# Patient Record
Sex: Female | Born: 1980 | Race: White | Hispanic: Yes | Marital: Single | Smoking: Never smoker
Health system: Southern US, Community
[De-identification: ages and names within clinical notes are randomized; demographics above are authoritative.]

## PROBLEM LIST (undated history)

## (undated) ENCOUNTER — Emergency Department (HOSPITAL_COMMUNITY): Admission: EM | Payer: BLUE CROSS/BLUE SHIELD | Source: Home / Self Care

## (undated) DIAGNOSIS — E559 Vitamin D deficiency, unspecified: Secondary | ICD-10-CM

## (undated) DIAGNOSIS — D649 Anemia, unspecified: Secondary | ICD-10-CM

## (undated) DIAGNOSIS — N83209 Unspecified ovarian cyst, unspecified side: Secondary | ICD-10-CM

## (undated) HISTORY — PX: CHOLECYSTECTOMY: SHX55

## (undated) HISTORY — PX: DILATION AND CURETTAGE OF UTERUS: SHX78

## (undated) HISTORY — DX: Vitamin D deficiency, unspecified: E55.9

## (undated) HISTORY — DX: Anemia, unspecified: D64.9

---

## 2005-09-16 ENCOUNTER — Encounter (INDEPENDENT_AMBULATORY_CARE_PROVIDER_SITE_OTHER): Payer: Self-pay | Admitting: Specialist

## 2005-09-16 ENCOUNTER — Inpatient Hospital Stay (HOSPITAL_COMMUNITY): Admission: EM | Admit: 2005-09-16 | Discharge: 2005-09-18 | Payer: Self-pay | Admitting: Emergency Medicine

## 2006-08-04 ENCOUNTER — Emergency Department (HOSPITAL_COMMUNITY): Admission: EM | Admit: 2006-08-04 | Discharge: 2006-08-04 | Payer: Self-pay | Admitting: Emergency Medicine

## 2006-12-16 ENCOUNTER — Ambulatory Visit: Payer: Self-pay | Admitting: Obstetrics & Gynecology

## 2007-08-03 ENCOUNTER — Ambulatory Visit: Payer: Self-pay | Admitting: Obstetrics and Gynecology

## 2007-08-03 ENCOUNTER — Encounter: Payer: Self-pay | Admitting: Obstetrics and Gynecology

## 2007-09-18 IMAGING — RF DG CHOLANGIOGRAM OPERATIVE
1 series · 4 of 4 positions shown · non-contrast
Comparison: none

CLINICAL DATA: Cholelithiasis

[Series 1: run · 4 of 99 frames shown]
[frame 15/99]
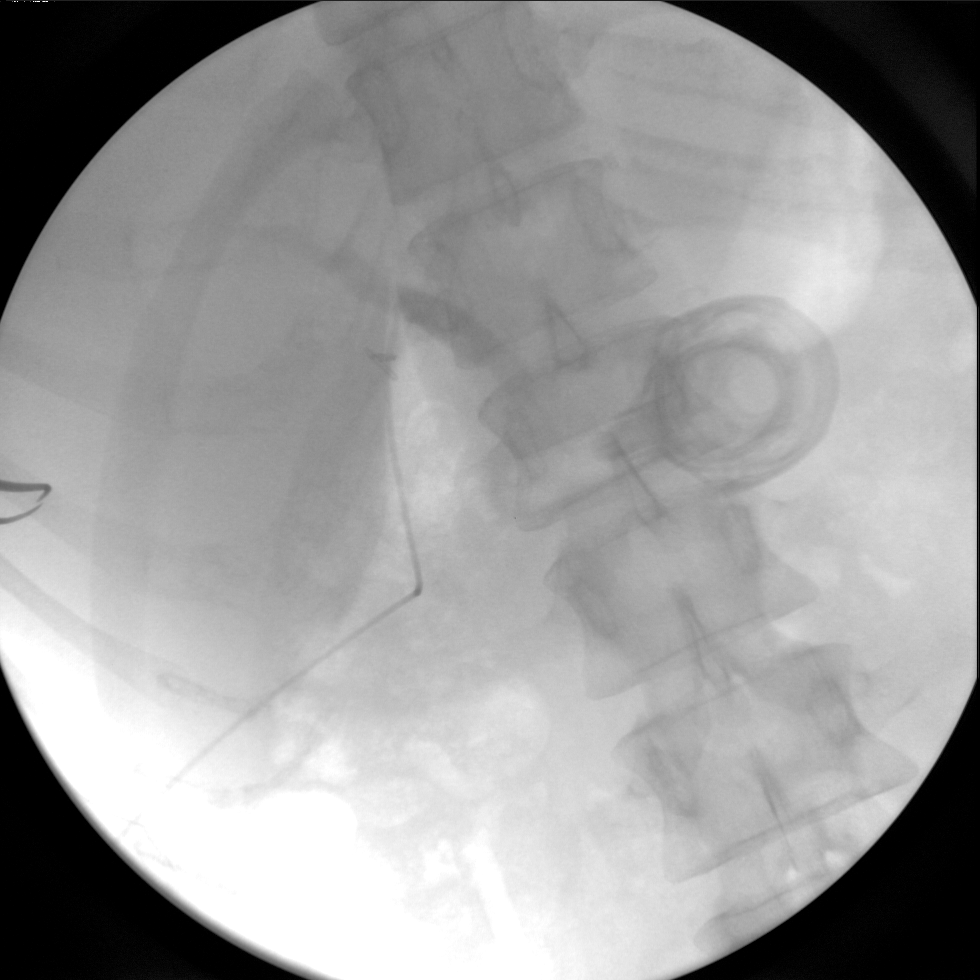
[frame 50/99]
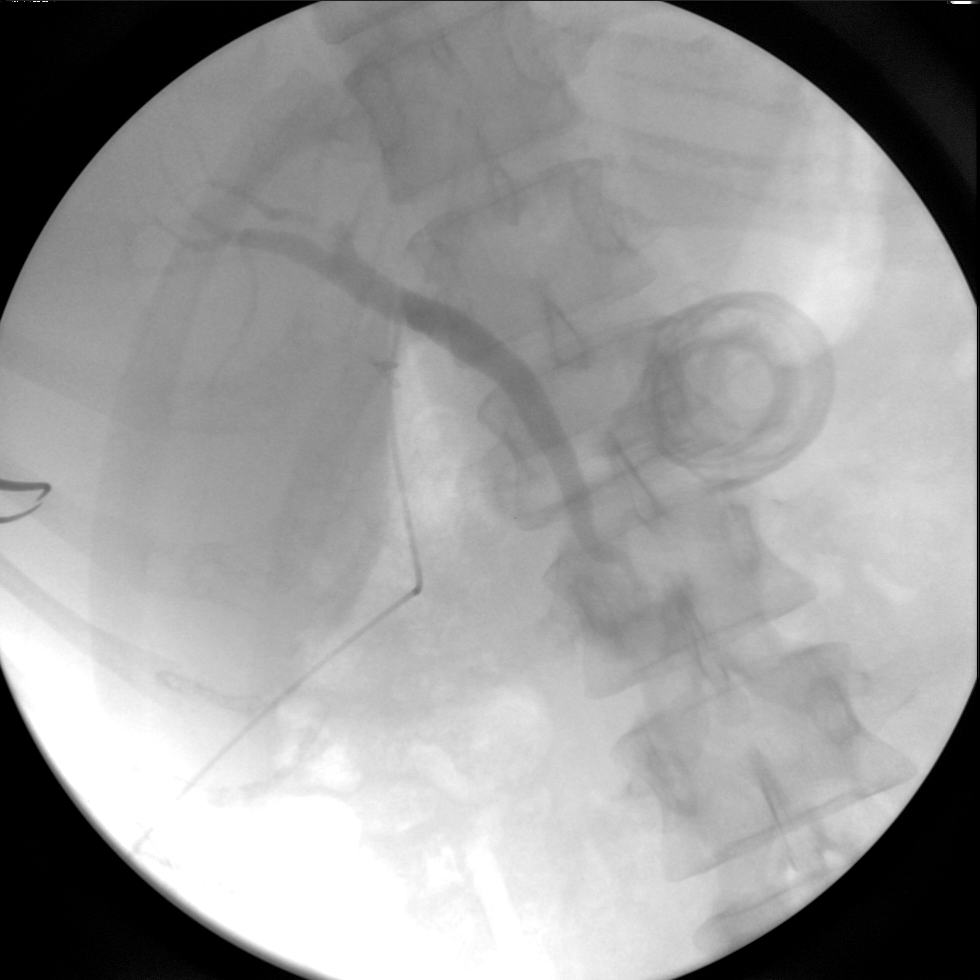
[frame 85/99]
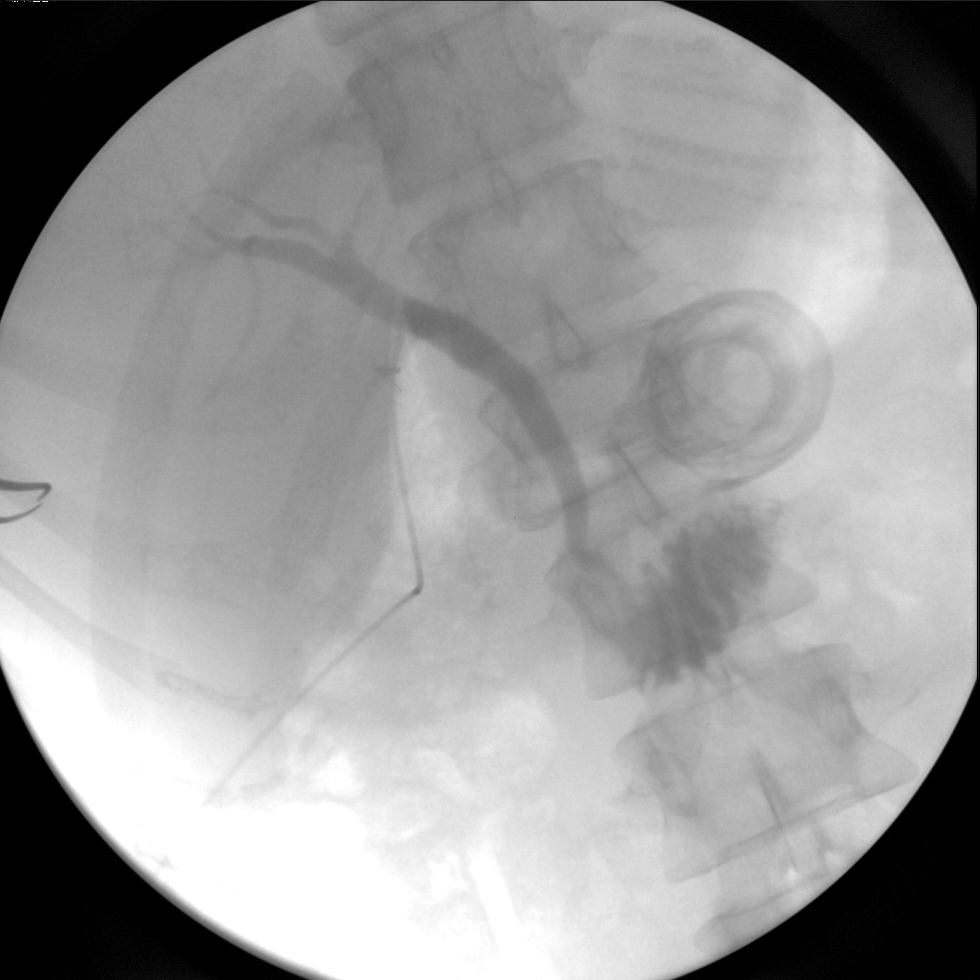
[frame 98/99]
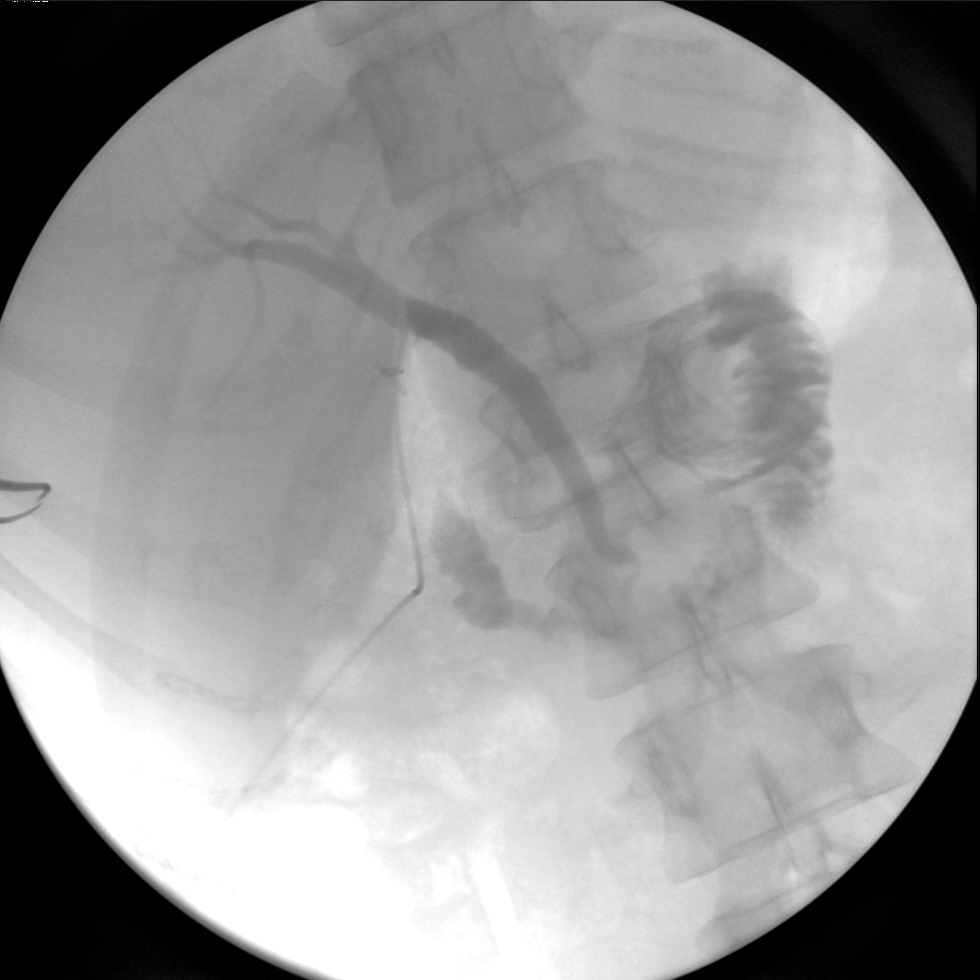

[4 of 4 positions shown; findings below may reference images not displayed]

INTRAOPERATIVE CHOLANGIOGRAM:

99  images from intraoperative C-arm fluoroscopy demonstrate  opacification of
the common bile duct. No filling defects to suggest retained stones. There is
incomplete evaluation of intrahepatic biliary tree, which appears decompressed
centrally. Contrast appears to flow on into decompressed duodenum.
IMPRESSION: 1. Negative for retained common duct stone

## 2010-11-28 NOTE — Op Note (Signed)
NAMEMELAINE, Heidi Bender               ACCOUNT NO.:  [id number]   MEDICAL RECORD NO.:  [id number]          PATIENT TYPE:  INP   LOCATION:  2550                         FACILITY:  [facility]   PHYSICIAN:  Adolph Pollack, M.D.DATE OF BIRTH:  11-02-1980   DATE OF PROCEDURE:  09/16/2005  DATE OF DISCHARGE:                                 OPERATIVE REPORT   PREOP DIAGNOSIS:  Evolving acute cholecystitis.   POSTOP:  Evolving acute cholecystitis.   PROCEDURE:  Laparoscopic cholecystectomy with intraoperative cholangiogram.   SURGEON:  Rosenbower.   ASSISTANT:  Chevis Pretty.   ANESTHESIA:  General.   INDICATIONS:  30 year old female has 4-day history of right upper quadrant  pain, nausea, vomiting. Ultrasound suggested acute edematous changes with  gallstones. She now presents for cholecystectomy.   TECHNIQUE:  She is brought to the holding area and brought to the operating  room, placed supine on the operating room. General anesthetic was  administered. The abdominal wall sterilely prepped and draped. Dilute  Marcaine solution was infiltrated in the subumbilical region. A small  subumbilical incision was made through skin and subcutaneous tissue, midline  fascia and peritoneum entering the peritoneal cavity under direct vision. A  pursestring suture of 0 Vicryl was placed around fascial edges. A Hasson  trocar was introduced into the peritoneal cavity. Pneumoperitoneum was  created by insufflation of CO2 gas.   Next laparoscope was introduced and she is placed in reverse Trendelenburg  position with right side tilted slightly up. 11 mm trocar was placed through  an epigastric incision two 5 mm trocars placed in the right mid abdomen. The  fundus of the gallbladder was grasped.  The gallbladder is noted to be  mildly to moderately inflamed and edematous. The fundus was retracted toward  the right shoulder. I then  grasped the infundibulum, then dissected omental adhesions free from it.  The  infundibulum was then mobilized using blunt dissection on the gallbladder. I  was then able to retract the infundibulum laterally and identify the cystic  duct and create a window around it. A clip was placed at the cystic duct  gallbladder junction and small incision made in the cystic duct and some  bile milked back. The Cholangiocath was passed through the anterior  abdominal wall into the cystic duct and cholangiogram was performed.   Under real time fluoroscopy dilute contrast was injected to the cystic duct.  The common hepatic duct, right and left hepatic ducts, and common duct all  filled. The contrast drained rapidly into the duodenum without obvious  evidence of obstruction. Final reports pending radiologist's interpretation.   The cholangiocatheter was removed, and the cystic duct was clipped three  times proximally and divided. I then identified an anterior branch of the  cystic artery clipped it and divided it. The posterior branch of the cystic  artery was identified, clipped and divided. The gallbladder was dissected  free from the liver using electrocautery, placed in Endopouch bag.   I irrigated out the gallbladder fossa. Bleeding was controlled with  electrocautery. I then placed Surgicel into the gallbladder fossa. I  reinspected  the area, it was hemostatic without evidence of bile leak. I  evacuated as much of the irrigation fluid as possible.   The gallbladder was then removed through the subumbilical port in the  Endopouch bag and multiple stones were noted. The subumbilical fascial  defect was closed under laparoscopic vision by tightening up and tying down  the pursestring suture. The remaining trocars were removed and  pneumoperitoneum was released. The skin incisions were closed with 4-0  Monocryl subcuticular stitches followed by Steri-Strips and sterile  dressings.   She tolerated the procedure without apparent complications and was taken to  recovery  in satisfactory condition.      Adolph Pollack, M.D.  Electronically Signed     TJR/MEDQ  D:  09/16/2005  T:  09/17/2005  Job:  [id number]

## 2010-11-28 NOTE — H&P (Signed)
NAMEBERLIN, VIERECK               ACCOUNT NO.:  [id number]   MEDICAL RECORD NO.:  [id number]          PATIENT TYPE:  INP   LOCATION:  2550                         FACILITY:  [facility]   PHYSICIAN:  Adolph Pollack, M.D.DATE OF BIRTH:  [birth year]   DATE OF ADMISSION:  09/16/2005  DATE OF DISCHARGE:                                HISTORY & PHYSICAL   CHIEF COMPLAINT:  Recurrent right upper quadrant pain, nausea and vomiting  for the past four days.   HISTORY OF PRESENT ILLNESS:  This 30 year old female began having some right  upper quadrant pain fairly severe with nausea and vomiting Saturday.  It has  continued every day since then including today.  She denies any fever or  chills.  She has had some diarrhea.  She eventually presented to the  emergency department where she was evaluated.  She is noted to have a normal  white blood cell count, a normal liver function test and lipase, however, an  ultrasound demonstrated an edematous gallbladder with gallstones.  I was  then asked to see her.   PAST MEDICAL HISTORY:  No chronic illnesses.   PREVIOUS OPERATIONS:  Dilatation and curettage.   MEDICATIONS:  Birth control pills.   ALLERGIES:  None.   SOCIAL HISTORY:  Single, unemployed, parents are with her.   REVIEW OF SYSTEMS:  CARDIOVASCULAR:  No heart disease, hypertension.  PULMONARY:  No chronic lung disease.  GI:  No hepatitis or gastrointestinal  disease.  GU:  No kidney stones or urinary problems.  HEMATOLOGIC:  No  bleeding disorders.   PHYSICAL EXAMINATION:  GENERAL:  A slightly ill-appearing female.  VITAL SIGNS:  Temperature is 98.3, blood pressure 95/63, pulse of 83.  EYES:  Extraocular motions intact.  No icterus.  NECK:  Supple without masses or obvious thyroid enlargement.  RESPIRATORY:  Breath sounds equal and clear.  Respirations are non-labored.  CARDIOVASCULAR:  Heart demonstrates a regular rate and rhythm.  No murmur  heard.  No lower extremity edema.  ABDOMEN:  Soft.  There is right upper quadrant tenderness without mass.  No  palpable masses.  A few bowel sounds are heard.  MUSCULOSKELETAL:  Good range of motion.  Good muscle tone.   LABORATORY DATA:  Liver function tests and white blood cell count are within  normal limits.  Pregnancy test negative.   IMPRESSION:  Evolving acute cholecystitis with cholelithiasis.   PLAN:  1.  Admission to the hospital.  2.  IV antibiotics.  3.  Laparoscopic cholecystectomy.  I went over the procedure, rationale and      risks through an interpreter with her and her family.  The risks      include, but are not limited to, bleeding, infection, wound healing      problems, anesthesia, accidental damage to intra-abdominal organs in the      area of the gallbladder, and post cholecystectomy diarrhea.  She seemed      to understand and agrees to proceed.      Adolph Pollack, M.D.  Electronically Signed     TJR/MEDQ  D:  09/16/2005  T:  09/16/2005  Job:  [id number]

## 2010-11-28 NOTE — Discharge Summary (Signed)
NAMELOURENE, HOSTON NO.:  [id number]   MEDICAL RECORD NO.:  [id number]          PATIENT TYPE:  INP   LOCATION:  5703                         FACILITY:  [facility]   PHYSICIAN:  Adolph Pollack, M.D.DATE OF BIRTH:  1981/07/02   DATE OF ADMISSION:  09/16/2005  DATE OF DISCHARGE:  09/18/2005                                 DISCHARGE SUMMARY   PRINCIPAL DISCHARGE DIAGNOSIS:  Chronic cholecystitis, cholelithiasis.   SECONDARY DIAGNOSIS:  None.   PROCEDURE:  Laparoscopic cholecystectomy.   REASON FOR ADMISSION:  This 30 year old female has a four-day history of  right upper quadrant pain, nausea and vomiting that is persisting.  Despite  pain medications in the emergency department, she still has the pain and was  admitted.   HOSPITAL COURSE:  She had an ultrasound showing an edematous gallbladder  wall, cholelithiasis and this was felt to be evolving into acute  cholecystitis.  She subsequently underwent a laparoscopic cholecystectomy  which she tolerated fairly well.  She had some periumbilical pain postop 1  but by her second postoperative day, she was much more comfortable and able  to be discharged.   DISPOSITION:  Discharge to home September 18, 2005 in satisfactory condition.  She was given discharge instructions and analgesic and will follow up in the  office in 1-2 weeks.      Adolph Pollack, M.D.  Electronically Signed     TJR/MEDQ  D:  10/30/2005  T:  10/31/2005  Job:  [id number]

## 2011-04-30 LAB — POCT PREGNANCY, URINE: Operator id: 149021

## 2023-02-10 ENCOUNTER — Encounter (HOSPITAL_COMMUNITY): Payer: Self-pay

## 2023-02-10 ENCOUNTER — Other Ambulatory Visit: Payer: Self-pay

## 2023-02-10 ENCOUNTER — Inpatient Hospital Stay (HOSPITAL_COMMUNITY)
Admission: EM | Admit: 2023-02-10 | Discharge: 2023-02-14 | DRG: 399 | Disposition: A | Discharge status: Home / Self Care | Payer: BLUE CROSS/BLUE SHIELD | Attending: General Surgery | Admitting: General Surgery

## 2023-02-10 DIAGNOSIS — K353 Acute appendicitis with localized peritonitis, without perforation or gangrene: Principal | ICD-10-CM

## 2023-02-10 DIAGNOSIS — Z9049 Acquired absence of other specified parts of digestive tract: Secondary | ICD-10-CM

## 2023-02-10 DIAGNOSIS — K381 Appendicular concretions: Secondary | ICD-10-CM | POA: Diagnosis present

## 2023-02-10 DIAGNOSIS — K660 Peritoneal adhesions (postprocedural) (postinfection): Secondary | ICD-10-CM | POA: Diagnosis present

## 2023-02-10 DIAGNOSIS — K3532 Acute appendicitis with perforation and localized peritonitis, without abscess: Principal | ICD-10-CM | POA: Diagnosis present

## 2023-02-10 HISTORY — DX: Unspecified ovarian cyst, unspecified side: N83.209

## 2023-02-10 LAB — COMPREHENSIVE METABOLIC PANEL
ALT: 61 U/L — ABNORMAL HIGH (ref 0–44)
AST: 47 U/L — ABNORMAL HIGH (ref 15–41)
Albumin: 3.9 g/dL (ref 3.5–5.0)
Alkaline Phosphatase: 72 U/L (ref 38–126)
Anion gap: 12 (ref 5–15)
BUN: 8 mg/dL (ref 6–20)
CO2: 20 mmol/L — ABNORMAL LOW (ref 22–32)
Calcium: 8.9 mg/dL (ref 8.9–10.3)
Chloride: 100 mmol/L (ref 98–111)
Creatinine, Ser: 0.69 mg/dL (ref 0.44–1.00)
GFR, Estimated: >60 mL/min (ref >60)
Glucose, Bld: 100 mg/dL — ABNORMAL HIGH (ref 70–99)
Potassium: 4 mmol/L (ref 3.5–5.1)
Sodium: 132 mmol/L — ABNORMAL LOW (ref 135–145)
Total Bilirubin: 1 mg/dL (ref 0.3–1.2)
Total Protein: 8.6 g/dL — ABNORMAL HIGH (ref 6.5–8.1)

## 2023-02-10 LAB — URINALYSIS, ROUTINE W REFLEX MICROSCOPIC
Bilirubin Urine: NEGATIVE
Glucose, UA: NEGATIVE mg/dL
Ketones, ur: NEGATIVE mg/dL
Leukocytes,Ua: NEGATIVE
Nitrite: NEGATIVE
Protein, ur: NEGATIVE mg/dL
Specific Gravity, Urine: 1.01 (ref 1.005–1.030)
pH: 7 (ref 5.0–8.0)

## 2023-02-10 LAB — URINALYSIS, MICROSCOPIC (REFLEX): Bacteria, UA: NONE SEEN

## 2023-02-10 LAB — HCG, SERUM, QUALITATIVE: Preg, Serum: NEGATIVE

## 2023-02-10 LAB — LIPASE, BLOOD: Lipase: 33 U/L (ref 11–51)

## 2023-02-10 LAB — CBC
HCT: 43.8 % (ref 36.0–46.0)
Hemoglobin: 14.1 g/dL (ref 12.0–15.0)
MCH: 27.8 pg (ref 26.0–34.0)
MCHC: 32.2 g/dL (ref 30.0–36.0)
MCV: 86.2 fL (ref 80.0–100.0)
Platelets: 261 10*3/uL (ref 150–400)
RBC: 5.08 MIL/uL (ref 3.87–5.11)
RDW: 15 % (ref 11.5–15.5)
WBC: 9 10*3/uL (ref 4.0–10.5)
nRBC: 0 % (ref 0.0–0.2)

## 2023-02-10 NOTE — ED Triage Notes (Signed)
Pt c/o RLQ abdominal pain that radiate into back and bloatingxmo. Pt states it got worse last night. Pt c/o nausea. Pt states had vomiting a week ago

## 2023-02-10 NOTE — ED Notes (Signed)
I  used interpreter to triage pt

## 2023-02-11 ENCOUNTER — Encounter (HOSPITAL_COMMUNITY): Payer: Self-pay

## 2023-02-11 ENCOUNTER — Encounter (HOSPITAL_COMMUNITY): Admission: EM | Disposition: A | Discharge status: Home / Self Care | Payer: Self-pay | Source: Home / Self Care

## 2023-02-11 ENCOUNTER — Emergency Department (HOSPITAL_COMMUNITY): Payer: BLUE CROSS/BLUE SHIELD

## 2023-02-11 ENCOUNTER — Emergency Department (HOSPITAL_COMMUNITY): Payer: BLUE CROSS/BLUE SHIELD | Admitting: Certified Registered"

## 2023-02-11 ENCOUNTER — Other Ambulatory Visit: Payer: Self-pay

## 2023-02-11 DIAGNOSIS — K381 Appendicular concretions: Secondary | ICD-10-CM | POA: Diagnosis present

## 2023-02-11 DIAGNOSIS — K3532 Acute appendicitis with perforation and localized peritonitis, without abscess: Secondary | ICD-10-CM | POA: Diagnosis present

## 2023-02-11 DIAGNOSIS — K353 Acute appendicitis with localized peritonitis, without perforation or gangrene: Secondary | ICD-10-CM | POA: Diagnosis present

## 2023-02-11 DIAGNOSIS — K660 Peritoneal adhesions (postprocedural) (postinfection): Secondary | ICD-10-CM | POA: Diagnosis present

## 2023-02-11 DIAGNOSIS — Z9049 Acquired absence of other specified parts of digestive tract: Secondary | ICD-10-CM | POA: Diagnosis not present

## 2023-02-11 HISTORY — PX: LAPAROSCOPIC APPENDECTOMY: SHX408

## 2023-02-11 LAB — HIV ANTIBODY (ROUTINE TESTING W REFLEX): HIV Screen 4th Generation wRfx: NONREACTIVE

## 2023-02-11 SURGERY — APPENDECTOMY, LAPAROSCOPIC
Anesthesia: General | Site: Abdomen

## 2023-02-11 MED ORDER — OXYCODONE HCL 5 MG PO TABS
5.0000 mg | ORAL_TABLET | ORAL | Status: DC | PRN
  Administered 2023-02-11: 10 mg via ORAL
  Filled 2023-02-11: qty 2

## 2023-02-11 MED ORDER — 0.9 % SODIUM CHLORIDE (POUR BTL) OPTIME
TOPICAL | Status: DC | PRN
  Administered 2023-02-11: 1000 mL

## 2023-02-11 MED ORDER — DIPHENHYDRAMINE HCL 50 MG/ML IJ SOLN: 25.0000 mg | Freq: Four times a day (QID) | INTRAMUSCULAR | Status: DC | PRN

## 2023-02-11 MED ORDER — ENOXAPARIN SODIUM 40 MG/0.4ML IJ SOSY
40.0000 mg | PREFILLED_SYRINGE | INTRAMUSCULAR | Status: DC
  Administered 2023-02-12 – 2023-02-14 (×3): 40 mg via SUBCUTANEOUS
  Filled 2023-02-11 (×3): qty 0.4

## 2023-02-11 MED ORDER — FENTANYL CITRATE (PF) 250 MCG/5ML IJ SOLN
INTRAMUSCULAR | Status: AC
  Filled 2023-02-11: qty 5

## 2023-02-11 MED ORDER — KCL IN DEXTROSE-NACL 20-5-0.45 MEQ/L-%-% IV SOLN
INTRAVENOUS | Status: DC
  Filled 2023-02-11 (×2): qty 1000

## 2023-02-11 MED ORDER — BUPIVACAINE-EPINEPHRINE 0.25% -1:200000 IJ SOLN
INTRAMUSCULAR | Status: DC | PRN
  Administered 2023-02-11: 17 mL

## 2023-02-11 MED ORDER — PANTOPRAZOLE SODIUM 40 MG PO TBEC
40.0000 mg | DELAYED_RELEASE_TABLET | Freq: Every day | ORAL | Status: DC
  Administered 2023-02-11 – 2023-02-14 (×4): 40 mg via ORAL
  Filled 2023-02-11 (×5): qty 1

## 2023-02-11 MED ORDER — HYDRALAZINE HCL 20 MG/ML IJ SOLN: 10.0000 mg | INTRAMUSCULAR | Status: DC | PRN

## 2023-02-11 MED ORDER — ONDANSETRON HCL 4 MG/2ML IJ SOLN
INTRAMUSCULAR | Status: AC
  Filled 2023-02-11: qty 4

## 2023-02-11 MED ORDER — METHOCARBAMOL 500 MG PO TABS
500.0000 mg | ORAL_TABLET | Freq: Three times a day (TID) | ORAL | Status: DC | PRN
  Administered 2023-02-11: 500 mg via ORAL
  Filled 2023-02-11: qty 1

## 2023-02-11 MED ORDER — DEXAMETHASONE SODIUM PHOSPHATE 10 MG/ML IJ SOLN
INTRAMUSCULAR | Status: AC
  Filled 2023-02-11: qty 2

## 2023-02-11 MED ORDER — MORPHINE SULFATE (PF) 2 MG/ML IV SOLN
1.0000 mg | INTRAVENOUS | Status: DC | PRN
  Administered 2023-02-11 – 2023-02-12 (×2): 2 mg via INTRAVENOUS
  Filled 2023-02-11 (×2): qty 1

## 2023-02-11 MED ORDER — FENTANYL CITRATE (PF) 100 MCG/2ML IJ SOLN
INTRAMUSCULAR | Status: AC
  Filled 2023-02-11: qty 2

## 2023-02-11 MED ORDER — METHOCARBAMOL 1000 MG/10ML IJ SOLN: 500.0000 mg | Freq: Three times a day (TID) | INTRAVENOUS | Status: DC | PRN

## 2023-02-11 MED ORDER — SODIUM CHLORIDE 0.9 % IV SOLN
2.0000 g | Freq: Once | INTRAVENOUS | Status: AC
  Administered 2023-02-11: 2 g via INTRAVENOUS
  Filled 2023-02-11: qty 20

## 2023-02-11 MED ORDER — LACTATED RINGERS IV SOLN: INTRAVENOUS | Status: DC

## 2023-02-11 MED ORDER — MIDAZOLAM HCL 2 MG/2ML IJ SOLN
INTRAMUSCULAR | Status: AC
  Filled 2023-02-11: qty 2

## 2023-02-11 MED ORDER — OXYCODONE HCL 5 MG/5ML PO SOLN: 5.0000 mg | Freq: Once | ORAL | Status: DC | PRN

## 2023-02-11 MED ORDER — SUGAMMADEX SODIUM 200 MG/2ML IV SOLN
INTRAVENOUS | Status: DC | PRN
  Administered 2023-02-11: 200 mg via INTRAVENOUS

## 2023-02-11 MED ORDER — ACETAMINOPHEN 325 MG PO TABS: 325.0000 mg | ORAL_TABLET | ORAL | Status: DC | PRN

## 2023-02-11 MED ORDER — CHLORHEXIDINE GLUCONATE 0.12 % MT SOLN
15.0000 mL | Freq: Once | OROMUCOSAL | Status: AC
  Administered 2023-02-11: 15 mL via OROMUCOSAL
  Filled 2023-02-11: qty 15

## 2023-02-11 MED ORDER — LIDOCAINE VISCOUS HCL 2 % MT SOLN
15.0000 mL | Freq: Once | OROMUCOSAL | Status: AC
  Administered 2023-02-11: 15 mL via ORAL
  Filled 2023-02-11: qty 15

## 2023-02-11 MED ORDER — MIDAZOLAM HCL 2 MG/2ML IJ SOLN
INTRAMUSCULAR | Status: DC | PRN
  Administered 2023-02-11: 2 mg via INTRAVENOUS

## 2023-02-11 MED ORDER — DIPHENHYDRAMINE HCL 50 MG/ML IJ SOLN
INTRAMUSCULAR | Status: AC
  Filled 2023-02-11: qty 1

## 2023-02-11 MED ORDER — ALUM & MAG HYDROXIDE-SIMETH 200-200-20 MG/5ML PO SUSP
30.0000 mL | Freq: Once | ORAL | Status: AC
  Administered 2023-02-11: 30 mL via ORAL
  Filled 2023-02-11: qty 30

## 2023-02-11 MED ORDER — ROCURONIUM BROMIDE 10 MG/ML (PF) SYRINGE
PREFILLED_SYRINGE | INTRAVENOUS | Status: DC | PRN
  Administered 2023-02-11: 65 mg via INTRAVENOUS
  Administered 2023-02-11: 15 mg via INTRAVENOUS

## 2023-02-11 MED ORDER — METOPROLOL TARTRATE 5 MG/5ML IV SOLN: 5.0000 mg | Freq: Four times a day (QID) | INTRAVENOUS | Status: DC | PRN

## 2023-02-11 MED ORDER — PROMETHAZINE HCL 25 MG/ML IJ SOLN: 6.2500 mg | INTRAMUSCULAR | Status: DC | PRN

## 2023-02-11 MED ORDER — KETOROLAC TROMETHAMINE 60 MG/2ML IM SOLN
30.0000 mg | Freq: Once | INTRAMUSCULAR | Status: AC
  Administered 2023-02-11: 30 mg via INTRAMUSCULAR
  Filled 2023-02-11: qty 2

## 2023-02-11 MED ORDER — SODIUM CHLORIDE 0.9 % IR SOLN
Status: DC | PRN
  Administered 2023-02-11: 1000 mL

## 2023-02-11 MED ORDER — ONDANSETRON HCL 4 MG/2ML IJ SOLN
INTRAMUSCULAR | Status: DC | PRN
  Administered 2023-02-11: 4 mg via INTRAVENOUS

## 2023-02-11 MED ORDER — ORAL CARE MOUTH RINSE: 15.0000 mL | Freq: Once | OROMUCOSAL | Status: AC

## 2023-02-11 MED ORDER — IOHEXOL 350 MG/ML SOLN
75.0000 mL | Freq: Once | INTRAVENOUS | Status: AC | PRN
  Administered 2023-02-11: 75 mL via INTRAVENOUS

## 2023-02-11 MED ORDER — BUPIVACAINE-EPINEPHRINE (PF) 0.25% -1:200000 IJ SOLN
INTRAMUSCULAR | Status: AC
  Filled 2023-02-11: qty 30

## 2023-02-11 MED ORDER — KETOROLAC TROMETHAMINE 30 MG/ML IJ SOLN
30.0000 mg | Freq: Four times a day (QID) | INTRAMUSCULAR | Status: DC
  Administered 2023-02-11 – 2023-02-14 (×11): 30 mg via INTRAVENOUS
  Filled 2023-02-11 (×11): qty 1

## 2023-02-11 MED ORDER — FENTANYL CITRATE (PF) 100 MCG/2ML IJ SOLN
25.0000 ug | INTRAMUSCULAR | Status: DC | PRN
  Administered 2023-02-11: 50 ug via INTRAVENOUS

## 2023-02-11 MED ORDER — PROPOFOL 10 MG/ML IV BOLUS
INTRAVENOUS | Status: AC
  Filled 2023-02-11: qty 20

## 2023-02-11 MED ORDER — ONDANSETRON 4 MG PO TBDP: 4.0000 mg | ORAL_TABLET | Freq: Four times a day (QID) | ORAL | Status: DC | PRN

## 2023-02-11 MED ORDER — ACETAMINOPHEN 500 MG PO TABS
1000.0000 mg | ORAL_TABLET | Freq: Four times a day (QID) | ORAL | Status: DC
  Administered 2023-02-11 – 2023-02-14 (×11): 1000 mg via ORAL
  Filled 2023-02-11 (×11): qty 2

## 2023-02-11 MED ORDER — METRONIDAZOLE 500 MG/100ML IV SOLN
500.0000 mg | Freq: Once | INTRAVENOUS | Status: AC
  Administered 2023-02-11: 500 mg via INTRAVENOUS
  Filled 2023-02-11: qty 100

## 2023-02-11 MED ORDER — FENTANYL CITRATE (PF) 250 MCG/5ML IJ SOLN
INTRAMUSCULAR | Status: DC | PRN
  Administered 2023-02-11: 100 ug via INTRAVENOUS
  Administered 2023-02-11: 25 ug via INTRAVENOUS

## 2023-02-11 MED ORDER — ACETAMINOPHEN 160 MG/5ML PO SOLN: 325.0000 mg | ORAL | Status: DC | PRN

## 2023-02-11 MED ORDER — LIDOCAINE 2% (20 MG/ML) 5 ML SYRINGE
INTRAMUSCULAR | Status: DC | PRN
  Administered 2023-02-11: 40 mg via INTRAVENOUS

## 2023-02-11 MED ORDER — SODIUM CHLORIDE 0.9 % IV BOLUS
1000.0000 mL | Freq: Once | INTRAVENOUS | Status: AC
  Administered 2023-02-11: 1000 mL via INTRAVENOUS

## 2023-02-11 MED ORDER — ACETAMINOPHEN 10 MG/ML IV SOLN: 1000.0000 mg | Freq: Once | INTRAVENOUS | Status: DC | PRN

## 2023-02-11 MED ORDER — MELATONIN 3 MG PO TABS: 3.0000 mg | ORAL_TABLET | Freq: Every evening | ORAL | Status: DC | PRN

## 2023-02-11 MED ORDER — HYOSCYAMINE SULFATE 0.125 MG SL SUBL
0.1250 mg | SUBLINGUAL_TABLET | Freq: Once | SUBLINGUAL | Status: AC
  Administered 2023-02-11: 0.125 mg via ORAL
  Filled 2023-02-11: qty 1

## 2023-02-11 MED ORDER — DEXAMETHASONE SODIUM PHOSPHATE 10 MG/ML IJ SOLN
INTRAMUSCULAR | Status: DC | PRN
  Administered 2023-02-11: 10 mg via INTRAVENOUS

## 2023-02-11 MED ORDER — PIPERACILLIN-TAZOBACTAM 3.375 G IVPB
3.3750 g | Freq: Three times a day (TID) | INTRAVENOUS | Status: DC
  Administered 2023-02-11 – 2023-02-14 (×9): 3.375 g via INTRAVENOUS
  Filled 2023-02-11 (×9): qty 50

## 2023-02-11 MED ORDER — AMISULPRIDE (ANTIEMETIC) 5 MG/2ML IV SOLN
10.0000 mg | Freq: Once | INTRAVENOUS | Status: AC | PRN
  Administered 2023-02-11: 10 mg via INTRAVENOUS

## 2023-02-11 MED ORDER — PROPOFOL 10 MG/ML IV BOLUS
INTRAVENOUS | Status: DC | PRN
  Administered 2023-02-11: 140 mg via INTRAVENOUS

## 2023-02-11 MED ORDER — DIPHENHYDRAMINE HCL 25 MG PO CAPS: 25.0000 mg | ORAL_CAPSULE | Freq: Four times a day (QID) | ORAL | Status: DC | PRN

## 2023-02-11 MED ORDER — LACTATED RINGERS IV SOLN: INTRAVENOUS | Status: DC | PRN

## 2023-02-11 MED ORDER — AMISULPRIDE (ANTIEMETIC) 5 MG/2ML IV SOLN
INTRAVENOUS | Status: AC
  Filled 2023-02-11: qty 4

## 2023-02-11 MED ORDER — OXYCODONE HCL 5 MG PO TABS: 5.0000 mg | ORAL_TABLET | Freq: Once | ORAL | Status: DC | PRN

## 2023-02-11 MED ORDER — SIMETHICONE 80 MG PO CHEW: 40.0000 mg | CHEWABLE_TABLET | Freq: Four times a day (QID) | ORAL | Status: DC | PRN

## 2023-02-11 MED ORDER — ONDANSETRON HCL 4 MG/2ML IJ SOLN: 4.0000 mg | Freq: Four times a day (QID) | INTRAMUSCULAR | Status: DC | PRN

## 2023-02-11 SURGICAL SUPPLY — 55 items
ADH SKN CLS APL DERMABOND .7 (GAUZE/BANDAGES/DRESSINGS) ×1
APL PRP STRL LF DISP 70% ISPRP (MISCELLANEOUS) ×1
APPLIER CLIP ROT 10 11.4 M/L (STAPLE)
APR CLP MED LRG 11.4X10 (STAPLE)
BAG COUNTER SPONGE SURGICOUNT (BAG) ×2 IMPLANT
BAG SPEC RTRVL 10 TROC 200 (ENDOMECHANICALS) ×1
BAG SPNG CNTER NS LX DISP (BAG) ×1
BIOPATCH RED 1 DISK 7.0 (GAUZE/BANDAGES/DRESSINGS) IMPLANT
BLADE CLIPPER SURG (BLADE) IMPLANT
CHLORAPREP W/TINT 26 (MISCELLANEOUS) ×2 IMPLANT
CLIP APPLIE ROT 10 11.4 M/L (STAPLE) IMPLANT
COVER SURGICAL LIGHT HANDLE (MISCELLANEOUS) ×2 IMPLANT
CUTTER FLEX LINEAR 45M (STAPLE) ×2 IMPLANT
DERMABOND ADVANCED .7 DNX12 (GAUZE/BANDAGES/DRESSINGS) ×2 IMPLANT
DRAIN CHANNEL 19F RND (DRAIN) IMPLANT
DRSG TEGADERM 4X4.75 (GAUZE/BANDAGES/DRESSINGS) IMPLANT
ELECT REM PT RETURN 9FT ADLT (ELECTROSURGICAL) ×1
ELECTRODE REM PT RTRN 9FT ADLT (ELECTROSURGICAL) ×2 IMPLANT
EVACUATOR SILICONE 100CC (DRAIN) IMPLANT
GLOVE BIO SURGEON STRL SZ8 (GLOVE) ×2 IMPLANT
GLOVE BIOGEL PI IND STRL 8 (GLOVE) ×2 IMPLANT
GOWN STRL REUS W/ TWL LRG LVL3 (GOWN DISPOSABLE) ×4 IMPLANT
GOWN STRL REUS W/ TWL XL LVL3 (GOWN DISPOSABLE) ×2 IMPLANT
GOWN STRL REUS W/TWL LRG LVL3 (GOWN DISPOSABLE) ×2
GOWN STRL REUS W/TWL XL LVL3 (GOWN DISPOSABLE) ×1
IRRIG SUCT STRYKERFLOW 2 WTIP (MISCELLANEOUS) ×1
IRRIGATION SUCT STRKRFLW 2 WTP (MISCELLANEOUS) ×2 IMPLANT
KIT BASIN OR (CUSTOM PROCEDURE TRAY) ×2 IMPLANT
KIT TURNOVER KIT B (KITS) ×2 IMPLANT
MANIFOLD NEPTUNE II (INSTRUMENTS) ×2 IMPLANT
NDL 22X1.5 STRL (OR ONLY) (MISCELLANEOUS) ×2 IMPLANT
NEEDLE 22X1.5 STRL (OR ONLY) (MISCELLANEOUS) ×1 IMPLANT
NS IRRIG 1000ML POUR BTL (IV SOLUTION) ×2 IMPLANT
PAD ARMBOARD 7.5X6 YLW CONV (MISCELLANEOUS) ×2 IMPLANT
POUCH RETRIEVAL ECOSAC 10 (ENDOMECHANICALS) ×2 IMPLANT
RELOAD 45 VASCULAR/THIN (ENDOMECHANICALS) IMPLANT
RELOAD STAPLE 45 2.5 WHT GRN (ENDOMECHANICALS) IMPLANT
RELOAD STAPLE 45 3.5 BLU ETS (ENDOMECHANICALS) IMPLANT
RELOAD STAPLE TA45 3.5 REG BLU (ENDOMECHANICALS) ×2 IMPLANT
SCISSORS LAP 5X35 DISP (ENDOMECHANICALS) IMPLANT
SET TUBE SMOKE EVAC HIGH FLOW (TUBING) ×2 IMPLANT
SHEARS HARMONIC ACE PLUS 36CM (ENDOMECHANICALS) ×2 IMPLANT
SPECIMEN JAR SMALL (MISCELLANEOUS) ×2 IMPLANT
SUT ETHILON 2 0 FS 18 (SUTURE) IMPLANT
SUT VIC AB 4-0 PS2 27 (SUTURE) ×2 IMPLANT
TOWEL GREEN STERILE (TOWEL DISPOSABLE) ×2 IMPLANT
TOWEL GREEN STERILE FF (TOWEL DISPOSABLE) ×2 IMPLANT
TRAY FOLEY W/BAG SLVR 16FR (SET/KITS/TRAYS/PACK)
TRAY FOLEY W/BAG SLVR 16FR ST (SET/KITS/TRAYS/PACK) IMPLANT
TRAY LAPAROSCOPIC MC (CUSTOM PROCEDURE TRAY) ×2 IMPLANT
TROCAR BALLN 12MMX100 BLUNT (TROCAR) ×2 IMPLANT
TROCAR Z THREAD OPTICAL 12X100 (TROCAR) ×2 IMPLANT
TROCAR Z-THREAD OPTICAL 5X100M (TROCAR) ×2 IMPLANT
WARMER LAPAROSCOPE (MISCELLANEOUS) ×2 IMPLANT
WATER STERILE IRR 1000ML POUR (IV SOLUTION) ×2 IMPLANT

## 2023-02-11 NOTE — ED Provider Notes (Signed)
[place] EMERGENCY DEPARTMENT AT [facility] Provider Note  CSN: [id number] Arrival date & time: 02/10/23 1810  Chief Complaint(s) Abdominal Pain and Back Pain  HPI Heidi Bender is a 42 y.o. female who presents to the emergency department with intermittent right-sided abdominal pain migrating to the rest of the abdomen for 1 month.  Usually postprandial.  Reports feeling bloated. Presented tonight because last night her pain got worse.  She is endorsing nausea without emesis.  No change in bowel habits.  She endorses urinary frequency without dysuria.  No other physical complaints.  The history is provided by the patient.    Past Medical History Past Medical History:  Diagnosis Date   Ovarian cyst    There are no problems to display for this patient.  Home Medication(s) Prior to Admission medications   Not on File                                                                                                                                    Allergies Patient has no known allergies.  Review of Systems Review of Systems As noted in HPI  Physical Exam Vital Signs  I have reviewed the triage vital signs BP 111/74   Pulse 63   Temp 97.8 F (36.6 C) (Oral)   Resp 18   Ht 5\' 10"  (1.778 m)   Wt 96.2 kg   SpO2 97%   BMI 30.42 kg/m   Physical Exam Vitals reviewed.  Constitutional:      General: She is not in acute distress.    Appearance: She is well-developed. She is not diaphoretic.  HENT:     Head: Normocephalic and atraumatic.     Right Ear: External ear normal.     Left Ear: External ear normal.     Nose: Nose normal.  Eyes:     General: No scleral icterus.    Conjunctiva/sclera: Conjunctivae normal.  Neck:     Trachea: Phonation normal.  Cardiovascular:     Rate and Rhythm: Normal rate and regular rhythm.  Pulmonary:     Effort: Pulmonary effort is normal. No respiratory distress.     Breath sounds: No stridor.  Abdominal:      General: There is distension.     Tenderness: There is abdominal tenderness in the right lower quadrant and suprapubic area.  Musculoskeletal:        General: Normal range of motion.     Cervical back: Normal range of motion.  Neurological:     Mental Status: She is alert and oriented to person, place, and time.  Psychiatric:        Behavior: Behavior normal.     ED Results and Treatments Labs (all labs ordered are listed, but only abnormal results are displayed) Labs Reviewed  COMPREHENSIVE METABOLIC PANEL - Abnormal; Notable for the following components:      Result Value   Sodium 132 (*)  CO2 20 (*)    Glucose, Bld 100 (*)    Total Protein 8.6 (*)    AST 47 (*)    ALT 61 (*)    All other components within normal limits  URINALYSIS, ROUTINE W REFLEX MICROSCOPIC - Abnormal; Notable for the following components:   Hgb urine dipstick TRACE (*)    All other components within normal limits  LIPASE, BLOOD  CBC  HCG, SERUM, QUALITATIVE  URINALYSIS, MICROSCOPIC (REFLEX)                                                                                                                         EKG  EKG Interpretation Date/Time:    Ventricular Rate:    PR Interval:    QRS Duration:    QT Interval:    QTC Calculation:   R Axis:      Text Interpretation:         Radiology CT ABDOMEN PELVIS W CONTRAST  Result Date: 02/11/2023 CLINICAL DATA:  Right lower quadrant abdominal pain radiating to back with bloating. Nausea and vomiting. EXAM: CT ABDOMEN AND PELVIS WITH CONTRAST TECHNIQUE: Multidetector CT imaging of the abdomen and pelvis was performed using the standard protocol following bolus administration of intravenous contrast. RADIATION DOSE REDUCTION: This exam was performed according to the departmental dose-optimization program which includes automated exposure control, adjustment of the mA and/or kV according to patient size and/or use of iterative reconstruction technique.  CONTRAST:  75mL OMNIPAQUE IOHEXOL 350 MG/ML SOLN COMPARISON:  None Available. FINDINGS: Lower chest: No acute abnormality. Hepatobiliary: No focal liver abnormality is seen. Fatty infiltration of the liver is noted. Status post cholecystectomy. No biliary dilatation. Pancreas: Unremarkable. No pancreatic ductal dilatation or surrounding inflammatory changes. Spleen: Normal in size without focal abnormality. Adrenals/Urinary Tract: The adrenal glands are within normal limits. The kidneys enhance symmetrically. A cyst is noted in the mid right kidney. No renal calculus or hydronephrosis. The bladder is unremarkable. Stomach/Bowel: The stomach is within normal limits. No bowel obstruction, free air, or pneumatosis. The appendix is distended measuring 1 cm diameter and contains an appendicolith. Surrounding periappendiceal fat stranding is noted. There is no free air or abscess. Vascular/Lymphatic: No significant vascular findings are present. A few prominent lymph nodes are present in the right lower quadrant, likely reactive. Reproductive: The uterus is within normal limits. There is a cystic structure with septation arising from the left adnexa measuring 11.3 x 9.2 cm. No adnexal mass on the right. Other: No abdominopelvic ascites. Musculoskeletal: Degenerative changes are noted in the thoracolumbar spine. No acute osseous abnormality. IMPRESSION: 1. Findings compatible with acute appendicitis. No free air or abscess. Surgical consultation is recommended. 2. Septated cystic structure in the left adnexa likely arising from the left ovary. Ultrasound is recommended for further characterization when clinically feasible. 3. Hepatic steatosis. Critical Value/emergent results were called by telephone at the time of interpretation on 02/11/2023 at 3:43 am to provider [facility] , who verbally acknowledged these results.  Electronically Signed   By: Thornell Sartorius M.D.   On: 02/11/2023 03:46    Medications Ordered in  ED Medications  cefTRIAXone (ROCEPHIN) 2 g in sodium chloride 0.9 % 100 mL IVPB (0 g Intravenous Stopped 02/11/23 0453)    And  metroNIDAZOLE (FLAGYL) IVPB 500 mg (500 mg Intravenous New Bag/Given 02/11/23 0500)  alum & mag hydroxide-simeth (MAALOX/MYLANTA) 200-200-20 MG/5ML suspension 30 mL (30 mLs Oral Given 02/11/23 0109)    And  lidocaine (XYLOCAINE) 2 % viscous mouth solution 15 mL (15 mLs Oral Given 02/11/23 0110)  hyoscyamine (LEVSIN SL) SL tablet 0.125 mg (0.125 mg Oral Given 02/11/23 0108)  ketorolac (TORADOL) injection 30 mg (30 mg Intramuscular Given 02/11/23 0115)  sodium chloride 0.9 % bolus 1,000 mL (0 mLs Intravenous Stopped 02/11/23 0441)  iohexol (OMNIPAQUE) 350 MG/ML injection 75 mL (75 mLs Intravenous Contrast Given 02/11/23 0328)   Procedures Procedures  (including critical care time) Medical Decision Making / ED Course   Medical Decision Making Amount and/or Complexity of Data Reviewed Labs: ordered. Radiology: ordered.  Risk OTC drugs. Prescription drug management. Decision regarding hospitalization.    R/S abd pain.  CBC without leukocytosis or anemia. Metabolic panel without significant electrolyte derangements or renal sufficiency. Mild transaminitis without biliary obstruction or pancreatitis. UA without evidence of infection hCG ruling out pregnancy related process  CT scan notable for acute appendicitis. Also noted large left adnexal cyst.  Patient started on antibiotics. Surgery consulted for further management.   Final Clinical Impression(s) / ED Diagnoses Final diagnoses:  Acute appendicitis with localized peritonitis, without perforation, abscess, or gangrene    This chart was dictated using voice recognition software.  Despite best efforts to proofread,  errors can occur which can change the documentation meaning.    Nira Conn, MD 02/11/23 [phone]

## 2023-02-11 NOTE — Anesthesia Preprocedure Evaluation (Signed)
Anesthesia Evaluation  Patient identified by MRN, date of birth, ID band Patient awake    Reviewed: Allergy & Precautions, NPO status , Patient's Chart, lab work & pertinent test results  Airway Mallampati: II  TM Distance: >3 FB Neck ROM: Full    Dental  (+) Teeth Intact, Dental Advisory Given   Pulmonary neg pulmonary ROS   breath sounds clear to auscultation       Cardiovascular  Rhythm:Regular Rate:Normal     Neuro/Psych negative neurological ROS  negative psych ROS   GI/Hepatic negative GI ROS, Neg liver ROS,,,  Endo/Other  negative endocrine ROS    Renal/GU negative Renal ROS     Musculoskeletal negative musculoskeletal ROS (+)    Abdominal   Peds  Hematology negative hematology ROS (+)   Anesthesia Other Findings   Reproductive/Obstetrics                             Anesthesia Physical Anesthesia Plan  ASA: 2  Anesthesia Plan: General   Post-op Pain Management: Tylenol PO (pre-op)* and Toradol IV (intra-op)*   Induction: Intravenous  PONV Risk Score and Plan: 4 or greater and Ondansetron, Dexamethasone, Midazolam and Scopolamine patch - Pre-op  Airway Management Planned: Oral ETT  Additional Equipment: None  Intra-op Plan:   Post-operative Plan: Extubation in OR  Informed Consent: I have reviewed the patients History and Physical, chart, labs and discussed the procedure including the risks, benefits and alternatives for the proposed anesthesia with the patient or authorized representative who has indicated his/her understanding and acceptance.     Dental advisory given  Plan Discussed with: CRNA  Anesthesia Plan Comments:        Anesthesia Quick Evaluation

## 2023-02-11 NOTE — ED Notes (Signed)
The pt re[ports that her pain is much better

## 2023-02-11 NOTE — Anesthesia Postprocedure Evaluation (Signed)
Anesthesia Post Note  Patient: Heidi Bender  Procedure(s) Performed: APPENDECTOMY LAPAROSCOPIC (Abdomen)     Patient location during evaluation: PACU Anesthesia Type: General Level of consciousness: awake and alert Pain management: pain level controlled Vital Signs Assessment: post-procedure vital signs reviewed and stable Respiratory status: spontaneous breathing, nonlabored ventilation, respiratory function stable and patient connected to nasal cannula oxygen Cardiovascular status: blood pressure returned to baseline and stable Postop Assessment: no apparent nausea or vomiting Anesthetic complications: no   No notable events documented.  Last Vitals:  Vitals:   02/11/23 1600 02/11/23 1617  BP: 104/67 107/70  Pulse: 78 76  Resp: 20 16  Temp: 36.4 C 36.6 C  SpO2: 96% 93%    Last Pain:  Vitals:   02/11/23 1617  TempSrc: Oral  PainSc: 7                  Isys Tietje P Martavius Lusty

## 2023-02-11 NOTE — ED Notes (Signed)
Pt ambulated to restroom without assistance.

## 2023-02-11 NOTE — Anesthesia Procedure Notes (Signed)
Procedure Name: Intubation Date/Time: 02/11/2023 1:36 PM  Performed by: Elliot Dally, CRNAPre-anesthesia Checklist: Patient identified, Emergency Drugs available, Suction available and Patient being monitored Patient Re-evaluated:Patient Re-evaluated prior to induction Oxygen Delivery Method: Circle System Utilized Preoxygenation: Pre-oxygenation with 100% oxygen Induction Type: IV induction Ventilation: Mask ventilation without difficulty Laryngoscope Size: Miller and 2 Grade View: Grade II Tube type: Oral Tube size: 7.0 mm Number of attempts: 2 (First attempt by SRNA, unable to pass ETT. 2nd attempt by CRNA grade IIb view) Airway Equipment and Method: Stylet and Oral airway Placement Confirmation: ETT inserted through vocal cords under direct vision, positive ETCO2 and breath sounds checked- equal and bilateral Secured at: 22 cm Tube secured with: Tape Dental Injury: Teeth and Oropharynx as per pre-operative assessment

## 2023-02-11 NOTE — Transfer of Care (Signed)
Immediate Anesthesia Transfer of Care Note  Patient: Heidi Bender  Procedure(s) Performed: APPENDECTOMY LAPAROSCOPIC (Abdomen)  Patient Location: PACU  Anesthesia Type:General  Level of Consciousness: drowsy  Airway & Oxygen Therapy: Patient Spontanous Breathing  Post-op Assessment: Report given to RN and Post -op Vital signs reviewed and stable  Post vital signs: Reviewed and stable  Last Vitals:  Vitals Value Taken Time  BP 104/67 02/11/23 1451  Temp 36.4 C 02/11/23 1451  Pulse 84 02/11/23 1453  Resp 16 02/11/23 1453  SpO2 96 % 02/11/23 1453  Vitals shown include unfiled device data.  Last Pain:  Vitals:   02/11/23 1237  TempSrc:   PainSc: 2          Complications: No notable events documented.

## 2023-02-11 NOTE — Op Note (Signed)
  02/11/2023  2:44 PM  PATIENT:  Heidi Bender  42 y.o. female  PRE-OPERATIVE DIAGNOSIS:  acute appedicitis  POST-OPERATIVE DIAGNOSIS:  Severe perforated appedicitis  PROCEDURE:  Procedure(s): APPENDECTOMY LAPAROSCOPIC  SURGEON:  Surgeon(s): Violeta Gelinas, MD  ASSISTANTS: none   ANESTHESIA:   local and general  EBL:  Total I/O In: -  Out: 25 [Blood:25]  BLOOD ADMINISTERED:none  DRAINS: (1) Jackson-Pratt drain(s) with closed bulb suction in the R abd    SPECIMEN:  Excision  DISPOSITION OF SPECIMEN:  PATHOLOGY  COUNTS:  YES  DICTATION: .Dragon Dictation Findings: Severe chronic perforated appendicitis  Procedure in detail informed consent was obtained.  She received intravenous antibiotics.  She was brought to the operating room and general endotracheal anesthesia was administered by the anesthesia staff.  Her abdomen was prepped and draped in a sterile fashion.  We did a timeout procedure.The infraumbilical region was infiltrated with local. Infraumbilical incision was made. Subcutaneous tissues were dissected down revealing the anterior fascia. This was divided sharply along the midline. Peritoneal cavity was entered under direct vision without complication. A 0 Vicryl pursestring was placed around the fascial opening. Hassan trocar was inserted into the abdomen. The abdomen was insufflated with carbon dioxide in standard fashion. Under direct vision a 12 mm left lower quadrant and a 5 mm right mid abdomen port were placed under direct vision.  At previous scar sites.  Local was used at each port site.  Laparoscopic exploration revealed the terminal ileum folded over and covering at the base of the cecum.  This was gently teased away.  There were some dense adhesions from the terminal ileum to the appendix which was ruptured with a significant amount of chronic inflammation.  About 2 cm of the base was intact.  The appendiceal artery was divided with the harmonic scalpel  getting good hemostasis.  I upsized the right sided port to a 12 mm in order to get a better angle for the stapler.  I was able to dissect out the base and divided it with Endo GIA with a blue load.  I did 2 firings.  I then was able to just remove the proximal couple centimeters of the appendix.  This was placed in a bag and sent to pathology.  The rest of it was densely scarred, fragmented, and stuck on the sidewall of the abdomen.  The area was copiously irrigated with multiple liters of saline.  There was excellent hemostasis.  The staple line looked intact on the cecum.  I placed a 11 French drain from the right port site down into the region.  It was secured with nylon.  The remainder the irrigation fluid was evacuated.  Ports were removed under direct vision.  Pneumoperitoneum was released.  The supraumbilical fascia was closed by tying the pursestring.  The wounds were irrigated and closed with 4-0 Vicryl followed by Dermabond.  All counts were correct.  She tolerated the procedure well without apparent complication and was taken recovery in stable condition.  PATIENT DISPOSITION:  PACU - hemodynamically stable.   Delay start of Pharmacological VTE agent (>24hrs) due to surgical blood loss or risk of bleeding:  no  Violeta Gelinas, MD, MPH, FACS Pager: [phone]  8/1/20242:44 PM

## 2023-02-11 NOTE — H&P (Signed)
Heidi Bender is an 42 y.o. female.   Chief Complaint: RLQ pain HPI: 42yo F with no significant PMHx C/O abdominal pain on and off over the last month.  It has been associated with nausea.  She has vomited with a couple of these episodes.  The pain returned yesterday but was much worse.  It is located in her right lower quadrant and she came to the emergency department.  Workup here includes laboratory studies which are unremarkable for the most part.  CT scan of the abdomen and pelvis is consistent with acute appendicitis with appendicolith.  I was asked to see her for surgical management.  Past Medical History:  Diagnosis Date   Ovarian cyst     Past Surgical History:  Procedure Laterality Date   CHOLECYSTECTOMY     DILATION AND CURETTAGE OF UTERUS      History reviewed. No pertinent family history. Social History:  reports that she has never smoked. She has never used smokeless tobacco. She reports that she does not drink alcohol and does not use drugs.  Allergies: No Known Allergies  (Not in a hospital admission)   Results for orders placed or performed during the hospital encounter of 02/10/23 (from the past 48 hour(s))  Lipase, blood     Status: None   Collection Time: 02/10/23  6:50 PM  Result Value Ref Range   Lipase 33 11 - 51 U/L    Comment: Performed at [facility], [address], [place], [place] [address]  Comprehensive metabolic panel     Status: Abnormal   Collection Time: 02/10/23  6:50 PM  Result Value Ref Range   Sodium 132 (L) 135 - 145 mmol/L   Potassium 4.0 3.5 - 5.1 mmol/L   Chloride 100 98 - 111 mmol/L   CO2 20 (L) 22 - 32 mmol/L   Glucose, Bld 100 (H) 70 - 99 mg/dL    Comment: Glucose reference range applies only to samples taken after fasting for at least 8 hours.   BUN 8 6 - 20 mg/dL   Creatinine, Ser 7.56 0.44 - 1.00 mg/dL   Calcium 8.9 8.9 - 43.3 mg/dL   Total Protein 8.6 (H) 6.5 - 8.1 g/dL   Albumin 3.9 3.5 - 5.0 g/dL   AST 47 (H)  15 - 41 U/L   ALT 61 (H) 0 - 44 U/L   Alkaline Phosphatase 72 38 - 126 U/L   Total Bilirubin 1.0 0.3 - 1.2 mg/dL   GFR, Estimated >29 >51 mL/min    Comment: (NOTE) Calculated using the CKD-EPI Creatinine Equation (2021)    Anion gap 12 5 - 15    Comment: Performed at [facility], [address] [place], [place] [address]  CBC     Status: None   Collection Time: 02/10/23  6:50 PM  Result Value Ref Range   WBC 9.0 4.0 - 10.5 K/uL   RBC 5.08 3.87 - 5.11 MIL/uL   Hemoglobin 14.1 12.0 - 15.0 g/dL   HCT 60.6 30.1 - 60.1 %   MCV 86.2 80.0 - 100.0 fL   MCH 27.8 26.0 - 34.0 pg   MCHC 32.2 30.0 - 36.0 g/dL   RDW 09.3 23.5 - 57.3 %   Platelets 261 150 - 400 K/uL   nRBC 0.0 0.0 - 0.2 %    Comment: Performed at [facility], [address] [place], [place] [address]  Urinalysis, Routine w reflex microscopic -Urine, Clean Catch  Status: Abnormal   Collection Time: 02/10/23  6:50 PM  Result Value Ref Range   Color, Urine YELLOW YELLOW    Comment: MICROSCOPIC EXAM PERFORMED ON UNCONCENTRATED URINE   APPearance CLEAR CLEAR    Comment: LESS THAN 10 mL OF URINE SUBMITTED   Specific Gravity, Urine 1.010 1.005 - 1.030   pH 7.0 5.0 - 8.0   Glucose, UA NEGATIVE NEGATIVE mg/dL   Hgb urine dipstick TRACE (A) NEGATIVE   Bilirubin Urine NEGATIVE NEGATIVE   Ketones, ur NEGATIVE NEGATIVE mg/dL   Protein, ur NEGATIVE NEGATIVE mg/dL   Nitrite NEGATIVE NEGATIVE   Leukocytes,Ua NEGATIVE NEGATIVE    Comment: Performed at [facility], [address], [place], [place] [address]  hCG, serum, qualitative     Status: None   Collection Time: 02/10/23  6:50 PM  Result Value Ref Range   Preg, Serum NEGATIVE NEGATIVE    Comment:        THE SENSITIVITY OF THIS METHODOLOGY IS >10 mIU/mL. Performed at [facility], [address], [place], [place] [address]   Urinalysis, Microscopic (reflex)     Status: None   Collection Time: 02/10/23  6:50 PM  Result Value Ref Range   RBC /  HPF 0-5 0 - 5 RBC/hpf   WBC, UA 0-5 0 - 5 WBC/hpf   Bacteria, UA NONE SEEN NONE SEEN   Squamous Epithelial / HPF 0-5 0 - 5 /HPF    Comment: Performed at [facility], [address] [place], [place] [address]   CT ABDOMEN PELVIS W CONTRAST  Result Date: 02/11/2023 CLINICAL DATA:  Right lower quadrant abdominal pain radiating to back with bloating. Nausea and vomiting. EXAM: CT ABDOMEN AND PELVIS WITH CONTRAST TECHNIQUE: Multidetector CT imaging of the abdomen and pelvis was performed using the standard protocol following bolus administration of intravenous contrast. RADIATION DOSE REDUCTION: This exam was performed according to the departmental dose-optimization program which includes automated exposure control, adjustment of the mA and/or kV according to patient size and/or use of iterative reconstruction technique. CONTRAST:  75mL OMNIPAQUE IOHEXOL 350 MG/ML SOLN COMPARISON:  None Available. FINDINGS: Lower chest: No acute abnormality. Hepatobiliary: No focal liver abnormality is seen. Fatty infiltration of the liver is noted. Status post cholecystectomy. No biliary dilatation. Pancreas: Unremarkable. No pancreatic ductal dilatation or surrounding inflammatory changes. Spleen: Normal in size without focal abnormality. Adrenals/Urinary Tract: The adrenal glands are within normal limits. The kidneys enhance symmetrically. A cyst is noted in the mid right kidney. No renal calculus or hydronephrosis. The bladder is unremarkable. Stomach/Bowel: The stomach is within normal limits. No bowel obstruction, free air, or pneumatosis. The appendix is distended measuring 1 cm diameter and contains an appendicolith. Surrounding periappendiceal fat stranding is noted. There is no free air or abscess. Vascular/Lymphatic: No significant vascular findings are present. A few prominent lymph nodes are present in the right lower quadrant, likely reactive. Reproductive: The uterus is within normal limits. There is a  cystic structure with septation arising from the left adnexa measuring 11.3 x 9.2 cm. No adnexal mass on the right. Other: No abdominopelvic ascites. Musculoskeletal: Degenerative changes are noted in the thoracolumbar spine. No acute osseous abnormality. IMPRESSION: 1. Findings compatible with acute appendicitis. No free air or abscess. Surgical consultation is recommended. 2. Septated cystic structure in the left adnexa likely arising from the left ovary. Ultrasound is recommended for further characterization when clinically feasible. 3. Hepatic steatosis. Critical Value/emergent results were called by telephone at the  time of interpretation on 02/11/2023 at 3:43 am to provider [facility] , who verbally acknowledged these results. Electronically Signed   By: Thornell Sartorius M.D.   On: 02/11/2023 03:46    Review of Systems  Constitutional:  Positive for appetite change.  HENT: Negative.    Eyes: Negative.   Respiratory: Negative.    Cardiovascular: Negative.   Gastrointestinal:  Positive for abdominal pain and nausea. Negative for vomiting.  Endocrine: Negative.   Genitourinary: Negative.   Musculoskeletal: Negative.   Allergic/Immunologic: Negative.   Neurological: Negative.   Hematological: Negative.     Blood pressure 101/65, pulse (!) 58, temperature 97.8 F (36.6 C), temperature source Oral, resp. rate 17, height 5\' 10"  (1.778 m), weight 96.2 kg, SpO2 96%. Physical Exam Constitutional:      General: She is not in acute distress. Cardiovascular:     Rate and Rhythm: Normal rate and regular rhythm.  Pulmonary:     Effort: Pulmonary effort is normal.     Breath sounds: Normal breath sounds. No wheezing.  Abdominal:     General: Abdomen is flat.     Palpations: Abdomen is soft.     Tenderness: There is abdominal tenderness in the right lower quadrant. There is no rebound.  Skin:    General: Skin is warm and dry.     Capillary Refill: Capillary refill takes less than 2 seconds.   Neurological:     Mental Status: She is alert and oriented to person, place, and time.  Psychiatric:        Mood and Affect: Mood normal.      Assessment/Plan Acute appendicitis -plan laparoscopic appendectomy.  She has received IV Rocephin and Flagyl.  I discussed the planned procedure, risks, and benefits with her in detail as well as her family member.  I answered her questions.  She is agreeable.  If her appendix is not perforated she may be able to go home from the recovery room.  I also discussed the expected postoperative course.  Liz Malady, MD 02/11/2023, 7:55 AM

## 2023-02-12 ENCOUNTER — Encounter (HOSPITAL_COMMUNITY): Payer: Self-pay | Admitting: General Surgery

## 2023-02-12 NOTE — Discharge Instructions (Addendum)
CIRUGIA LAPAROSCOPICA: INSTRUCCIONES DE POST OPERATORIO.  Revise siempre los documentos que le entreguen en el lugar donde se ha hecho la cirugia.  SI USTED NECESITA DOCUMENTOS DE INCAPACIDAD (DISABLE) O DE PERMISO FAMILAR (FAMILY LEAVE) NECESITA TRAERLOS A LA OFICINA PARA QUE SEAN PROCESADOS. NO  SE LOS DE A SU DOCTOR. A su alta del hospital se le dara una receta para controlar el dolor. Tomela como ha sido recetada, si la necesita. Si no la necesita puede tomar, Acetaminofen (Tylenol) o Ibuprofen (Advil) para aliviar dolor moderado. Continue tomando el resto de sus medicinas. Si necesita rellenar la receta, llame a la farmacia. ellos contactan a nuestra oficina pidiendo autorizacion. Este tipo de receta no pueden ser rellenadas despues de las  5pm o durante los fines de semana. Con relacion a la dieta: debe ser ligera los primeros dias despues que llege a la casa. Ejemplo: sopas y galleticas. Tome bastante liquido esos dias. La mayoria de los pacientes padecen de inflamacion y cambio de coloracion de la piel alrededor de las incisiones. esto toma dias en resolver.  pnerse una bolsa de hielo en el area affectada ayuda..  Es comun tambien tener un poco de estrenimiento si esta tomado medicinas para el dolor. incremente la cantidad de liquidos a tomar y puede tomar (Colace) esto previene el problema. Si ya tiene estrenimiento, es decir no ha defecado en 48 horas, puede tomar un laxativo (Milk of Magnesia or Miralax) uselo como el paquete le explica.  A menos que se le diga algo diferente. Remueva el bendaje a las 24-48 horas despues dela cirugia. y puede banarse en la ducha sin ningun problema. usted puede tener steri-strips (pequenas curitas transparentes en la piel puesta encima de la incision)  Estas banditas strips should be left on the skin for 7-10 days.   Si su cirujano puso pegamento encima de la incision usted puede banarse bajo la ducha en 24 horas. Este pegamento empezara a caerse en las  proximas 2-3 semanas. Si le pusieron suturas o presillas (grapos) estos seran quitados en su proxima cita en la oficina. . ACTIVIDADES:  Puede hacer actividad ligera.  Como caminar , subir escaleras y poco a poco irlas incrementando tanto como las tolere. Puede tener relaciones sexuales cuando sea comfortable. No carge objetos pesados o haga esfuerzos que no sean aprovados por su doctor. Puede manejar en cuanto no esta tomando medicamentos fuertes (narcoticos) para el dolor, pueda abrochar confortablemente el cinturon de seguridad, y pueda maniobrar y usar los pedales de su vehiculo con seguridad. PUEDE REGRESAR A TRABAJAR  Debe ver a su doctor para una cita de seguimiento en 2-3 semanas despues de la cirugia.  OTRAS ISNSTRUCCIONES:___________________________________________________________________________________ CUANDO LLAMAR A SU MEDICO: FIEBRE mayor de  101.0 No produccion de orina. Sangramiento continue de la herida Incremento de dolor, enrojecimientio o drenaje de la herida (incision) Incremento de dolor abdominal.  The clinic staff is available to answer your questions during regular business hours.  Please don't hesitate to call and ask to speak to one of the nurses for clinical concerns.  If you have a medical emergency, go to the nearest emergency room or call 911.  A surgeon from [facility] is always on call at the hospital. [address], [address], [place], [place]  [address] ? [address] [address], [place], [place]   [address] [phone] ? [phone] ? FAX [phone] Web site: [email or web address]  

## 2023-02-12 NOTE — Progress Notes (Signed)
[facility] Progress Note  1 Day Post-Op  Subjective: CC-  Family at bedside. Abdomen sore but pain well controlled. Some bloating but denies n/v. No flatus or BM. Tolerating sips of water.  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  PE: Gen:  Alert, NAD, pleasant Card:  RRR Pulm:  CTAB, no W/R/R, rate and effort normal on room air Abd: Soft, mild distension, hypoactive bowel sounds, appropriately tender, lap incisions cdi, JP serosanguinous  Lab Results:  Recent Labs    02/10/23 1850 02/12/23 0430  WBC 9.0 11.7*  HGB 14.1 12.2  HCT 43.8 36.8  PLT 261 276   BMET Recent Labs    02/10/23 1850 02/12/23 0430  NA 132* 136  K 4.0 4.1  CL 100 102  CO2 20* 23  GLUCOSE 100* 177*  BUN 8 7  CREATININE 0.69 0.71  CALCIUM 8.9 8.4*   PT/INR No results for input(s): "LABPROT", "INR" in the last 72 hours. CMP     Component Value Date/Time   NA 136 02/12/2023 0430   K 4.1 02/12/2023 0430   CL 102 02/12/2023 0430   CO2 23 02/12/2023 0430   GLUCOSE 177 (H) 02/12/2023 0430   BUN 7 02/12/2023 0430   CREATININE 0.71 02/12/2023 0430   CALCIUM 8.4 (L) 02/12/2023 0430   PROT 8.6 (H) 02/10/2023 1850   ALBUMIN 3.9 02/10/2023 1850   AST 47 (H) 02/10/2023 1850   ALT 61 (H) 02/10/2023 1850   ALKPHOS 72 02/10/2023 1850   BILITOT 1.0 02/10/2023 1850   GFRNONAA >60 02/12/2023 0430   Lipase     Component Value Date/Time   LIPASE 33  02/10/2023 1850       Studies/Results: CT ABDOMEN PELVIS W CONTRAST  Result Date: 02/11/2023 CLINICAL DATA:  Right lower quadrant abdominal pain radiating to back with bloating. Nausea and vomiting. EXAM: CT ABDOMEN AND PELVIS WITH CONTRAST TECHNIQUE: Multidetector CT imaging of the abdomen and pelvis was performed using the standard protocol following bolus administration of intravenous contrast. RADIATION DOSE REDUCTION: This exam was performed according to the departmental dose-optimization program which includes automated exposure control, adjustment of the mA and/or kV according to patient size and/or use of iterative reconstruction technique. CONTRAST:  75mL OMNIPAQUE IOHEXOL 350 MG/ML SOLN COMPARISON:  None Available. FINDINGS: Lower chest: No acute abnormality. Hepatobiliary: No focal liver abnormality is seen. Fatty infiltration of the liver is noted. Status post cholecystectomy. No biliary dilatation. Pancreas: Unremarkable. No pancreatic ductal dilatation or surrounding inflammatory changes. Spleen: Normal in size without focal abnormality. Adrenals/Urinary Tract: The adrenal glands are within normal limits. The kidneys enhance symmetrically. A cyst is noted in the mid right kidney. No renal calculus or hydronephrosis. The bladder is unremarkable. Stomach/Bowel: The stomach is within normal limits. No bowel obstruction, free air, or pneumatosis. The appendix is distended measuring 1 cm diameter and contains an appendicolith. Surrounding periappendiceal fat stranding is noted. There is no free air or abscess. Vascular/Lymphatic: No significant vascular  findings are present. A few prominent lymph nodes are present in the right lower quadrant, likely reactive. Reproductive: The uterus is within normal limits. There is a cystic structure with septation arising from the left adnexa measuring 11.3 x 9.2 cm. No adnexal mass on the right. Other: No abdominopelvic ascites. Musculoskeletal: Degenerative  changes are noted in the thoracolumbar spine. No acute osseous abnormality. IMPRESSION: 1. Findings compatible with acute appendicitis. No free air or abscess. Surgical consultation is recommended. 2. Septated cystic structure in the left adnexa likely arising from the left ovary. Ultrasound is recommended for further characterization when clinically feasible. 3. Hepatic steatosis. Critical Value/emergent results were called by telephone at the time of interpretation on 02/11/2023 at 3:43 am to provider [facility] , who verbally acknowledged these results. Electronically Signed   By: Thornell Sartorius M.D.   On: 02/11/2023 03:46    Anti-infectives: Anti-infectives (From admission, onward)    Start     Dose/Rate Route Frequency Ordered Stop   02/11/23 1715  piperacillin-tazobactam (ZOSYN) IVPB 3.375 g        3.375 g 12.5 mL/hr over 240 Minutes Intravenous Every 8 hours 02/11/23 1629 02/18/23 1359   02/11/23 0400  cefTRIAXone (ROCEPHIN) 2 g in sodium chloride 0.9 % 100 mL IVPB       Placed in "And" Linked Group   2 g 200 mL/hr over 30 Minutes Intravenous  Once 02/11/23 0348 02/11/23 0453   02/11/23 0400  metroNIDAZOLE (FLAGYL) IVPB 500 mg       Placed in "And" Linked Group   500 mg 100 mL/hr over 60 Minutes Intravenous  Once 02/11/23 0348 02/11/23 0606        Assessment/Plan Perforated appendicitis  POD#1 s/p laparoscopic appendectomy 8/1 Dr. Janee Morn - continue clear liquids and await return in bowel function - mobilize - scheduled tylenol and toradol working well for pain - continue JP and monitor, currently serosanguinous - continue IV antibiotics  ID - zosyn 8/1>> FEN - IVF, CLD VTE - SCDs, lovenox Foley - none    LOS: 1 day    Franne Forts, PA-C [facility] 02/12/2023, 9:10 AM Please see Amion for pager number during day hours 7:00am-4:30pm

## 2023-02-13 NOTE — Progress Notes (Signed)
    Assessment & Plan: Perforated appendicitis  POD#2 - s/p laparoscopic appendectomy 8/1 Dr. Janee Morn  - regular diet this AM - mobilize, ambulating in hallway - continue IV antibiotics, WBC improved 10.6 - instruct patient and family in drain care - anticipate discharge home tomorrow with drain and oral abx's   ID - zosyn 8/1>> FEN - IVF, Regular VTE - SCDs, lovenox Foley - none          Darnell Level, MD [facility] practice Office: [phone]        Chief Complaint: Perforated appendicitis  Subjective: Patient up in chair, brother in room.  Tolerated oatmeal for breakfast.  Ambulating.  Objective: Vital signs in last 24 hours: Temp:  [97.6 F (36.4 C)-98.7 F (37.1 C)] 98 F (36.7 C) (08/03 0800) Pulse Rate:  [63-74] 67 (08/03 0800) Resp:  [17-18] 18 (08/03 0800) BP: (94-111)/(65-76) 111/76 (08/03 0800) SpO2:  [97 %-99 %] 98 % (08/03 0800) Last BM Date : 03/13/23  Intake/Output from previous day: 08/02 0701 - 08/03 0700 In: 705 [P.O.:240; I.V.:306.9; IV Piggyback:158.1] Out: 60 [Drains:60] Intake/Output this shift: No intake/output data recorded.  Physical Exam: HEENT - sclerae clear, mucous membranes moist Neck - soft Abdomen - soft, minimal tenderness; JP with large serosanguinous output Ext - no edema, non-tender Neuro - alert & oriented, no focal deficits  Lab Results:  Recent Labs    02/12/23 0430 02/13/23 0149  WBC 11.7* 10.6*  HGB 12.2 11.4*  HCT 36.8 35.9*  PLT 276 291   BMET Recent Labs    02/12/23 0430 02/13/23 0149  NA 136 137  K 4.1 3.6  CL 102 101  CO2 23 23  GLUCOSE 177* 107*  BUN 7 10  CREATININE 0.71 0.72  CALCIUM 8.4* 8.4*   PT/INR No results for input(s): "LABPROT", "INR" in the last 72 hours. Comprehensive Metabolic Panel:    Component Value Date/Time   NA 137 02/13/2023 0149   NA 136 02/12/2023 0430   K 3.6 02/13/2023 0149   K 4.1 02/12/2023 0430   CL 101 02/13/2023 0149   CL 102  02/12/2023 0430   CO2 23 02/13/2023 0149   CO2 23 02/12/2023 0430   BUN 10 02/13/2023 0149   BUN 7 02/12/2023 0430   CREATININE 0.72 02/13/2023 0149   CREATININE 0.71 02/12/2023 0430   GLUCOSE 107 (H) 02/13/2023 0149   GLUCOSE 177 (H) 02/12/2023 0430   CALCIUM 8.4 (L) 02/13/2023 0149   CALCIUM 8.4 (L) 02/12/2023 0430   AST 47 (H) 02/10/2023 1850   ALT 61 (H) 02/10/2023 1850   ALKPHOS 72 02/10/2023 1850   BILITOT 1.0 02/10/2023 1850   PROT 8.6 (H) 02/10/2023 1850   ALBUMIN 3.9 02/10/2023 1850    Studies/Results: No results found.    Darnell Level 02/13/2023  Patient ID: Heidi Bender, female   DOB: [birth year], 42 y.o.   MRN: [id number]

## 2023-02-14 MED ORDER — AMOXICILLIN-POT CLAVULANATE 875-125 MG PO TABS: 1.0000 | ORAL_TABLET | Freq: Two times a day (BID) | ORAL | 0 refills | Status: DC

## 2023-02-14 NOTE — Plan of Care (Signed)

## 2023-02-14 NOTE — Progress Notes (Signed)
Patient discharged, discharge instructions given and explained. Interpreter present [id number], no additional questions and or concerns at this time. Patient IV removed per protocol. JP drain instructions given and explained with measurement cup to keep track of measurements. Additional supplies given, and patient states understanding. Patient discharged in wheelchair for safety with brother home.

## 2023-02-14 NOTE — Discharge Summary (Signed)
    Physician Discharge Summary   Patient ID: Heidi Bender MRN: [id number] DOB/AGE: 1981/05/01 42 y.o.  Admit date: 02/10/2023  Discharge date: 02/14/2023  Discharge Diagnoses:  Principal Problem:   Acute perforated appendicitis   Discharged Condition: good  Hospital Course: Patient was admitted for observation following laparoscopic appendectomy for perforated appendicitis.  Post op course was uncomplicated.  Pain was well controlled.  Tolerated diet.  IV Zosyn post op.  Will go home on oral Augmentin for an additional 5 days.  Patient was prepared for discharge home on POD#3.  Consults: None  Treatments: surgery: laparoscopic appendectomy with drain placement  Discharge Exam: Blood pressure 107/81, pulse 72, temperature 98.4 F (36.9 C), temperature source Oral, resp. rate 18, height 5\' 10"  (1.778 m), weight 96.2 kg, SpO2 98%. HEENT - clear Abd - wounds dry and intact; JP with serosanguinous output  Disposition: Home  Discharge Instructions     Diet - low sodium heart healthy   Complete by: As directed    Discharge wound care:   Complete by: As directed    Drain care as instructed.  May shower.   Increase activity slowly   Complete by: As directed       Allergies as of 02/14/2023   No Known Allergies      Medication List     TAKE these medications    amoxicillin-clavulanate 875-125 MG tablet Commonly known as: AUGMENTIN Take 1 tablet by mouth 2 (two) times daily.               Discharge Care Instructions  (From admission, onward)           Start     Ordered   02/14/23 0000  Discharge wound care:       Comments: Drain care as instructed.  May shower.   02/14/23 1007            Follow-up Information     [facility] Go on 02/18/2023.   Specialty: General Surgery Why: Your appointment is 8/8 at 11am Arrive early to check in, fill out paperwork, Bring photo ID and insurance information Contact  information: [address] [place] [address] [phone]        Maczis, Hedda Slade, [place]. Go on 03/04/2023.   Specialty: General Surgery Why: Your appointment is 8/22 at 1:30pm Please arrive 15 minutes early for check in. Contact information: [address] [place] [address] [phone]                 Darnell Level, MD [facility] Office: [phone]   Signed: Darnell Level 02/14/2023, 10:07 AM

## 2023-02-14 NOTE — Plan of Care (Deleted)

## 2023-03-04 ENCOUNTER — Other Ambulatory Visit: Payer: Self-pay

## 2023-03-04 ENCOUNTER — Emergency Department (HOSPITAL_COMMUNITY)
Admission: EM | Admit: 2023-03-04 | Discharge: 2023-03-05 | Disposition: A | Discharge status: Home / Self Care | Payer: BLUE CROSS/BLUE SHIELD | Attending: Emergency Medicine | Admitting: Emergency Medicine

## 2023-03-04 ENCOUNTER — Encounter (HOSPITAL_COMMUNITY): Payer: Self-pay | Admitting: Emergency Medicine

## 2023-03-04 ENCOUNTER — Emergency Department (HOSPITAL_COMMUNITY): Payer: BLUE CROSS/BLUE SHIELD

## 2023-03-04 DIAGNOSIS — R109 Unspecified abdominal pain: Secondary | ICD-10-CM

## 2023-03-04 DIAGNOSIS — R140 Abdominal distension (gaseous): Secondary | ICD-10-CM | POA: Insufficient documentation

## 2023-03-04 DIAGNOSIS — R1031 Right lower quadrant pain: Secondary | ICD-10-CM | POA: Insufficient documentation

## 2023-03-04 DIAGNOSIS — T889XXA Complication of surgical and medical care, unspecified, initial encounter: Secondary | ICD-10-CM | POA: Diagnosis not present

## 2023-03-04 LAB — URINALYSIS, ROUTINE W REFLEX MICROSCOPIC
Bilirubin Urine: NEGATIVE
Glucose, UA: NEGATIVE mg/dL
Hgb urine dipstick: NEGATIVE
Ketones, ur: NEGATIVE mg/dL
Leukocytes,Ua: NEGATIVE
Nitrite: NEGATIVE
Protein, ur: 30 mg/dL — AB
Specific Gravity, Urine: 1.026 (ref 1.005–1.030)
pH: 5 (ref 5.0–8.0)

## 2023-03-04 LAB — CBC
HCT: 37.5 % (ref 36.0–46.0)
Hemoglobin: 12 g/dL (ref 12.0–15.0)
MCH: 28.2 pg (ref 26.0–34.0)
MCHC: 32 g/dL (ref 30.0–36.0)
MCV: 88.2 fL (ref 80.0–100.0)
Platelets: 354 10*3/uL (ref 150–400)
RBC: 4.25 MIL/uL (ref 3.87–5.11)
RDW: 14.4 % (ref 11.5–15.5)
WBC: 10 10*3/uL (ref 4.0–10.5)
nRBC: 0 % (ref 0.0–0.2)

## 2023-03-04 LAB — COMPREHENSIVE METABOLIC PANEL
ALT: 34 U/L (ref 0–44)
AST: 24 U/L (ref 15–41)
Albumin: 3.4 g/dL — ABNORMAL LOW (ref 3.5–5.0)
Alkaline Phosphatase: 65 U/L (ref 38–126)
Anion gap: 9 (ref 5–15)
BUN: 7 mg/dL (ref 6–20)
CO2: 24 mmol/L (ref 22–32)
Calcium: 8.5 mg/dL — ABNORMAL LOW (ref 8.9–10.3)
Chloride: 103 mmol/L (ref 98–111)
Creatinine, Ser: 0.66 mg/dL (ref 0.44–1.00)
GFR, Estimated: >60 mL/min (ref >60)
Glucose, Bld: 99 mg/dL (ref 70–99)
Potassium: 3.6 mmol/L (ref 3.5–5.1)
Sodium: 136 mmol/L (ref 135–145)
Total Bilirubin: 0.6 mg/dL (ref 0.3–1.2)
Total Protein: 7.5 g/dL (ref 6.5–8.1)

## 2023-03-04 LAB — HCG, SERUM, QUALITATIVE: Preg, Serum: NEGATIVE

## 2023-03-04 LAB — LIPASE, BLOOD: Lipase: 23 U/L (ref 11–51)

## 2023-03-04 MED ORDER — IOHEXOL 350 MG/ML SOLN
75.0000 mL | Freq: Once | INTRAVENOUS | Status: AC | PRN
  Administered 2023-03-04: 75 mL via INTRAVENOUS

## 2023-03-04 NOTE — ED Triage Notes (Signed)
Pt with RLQ pain since her appendectomy 3 weeks ago.  Pt reports no vomiting but does need to have a bowel movement every time she eats. No diarrhea. No urinary symptoms.

## 2023-03-04 NOTE — ED Provider Notes (Signed)
[place] EMERGENCY DEPARTMENT AT [facility] Provider Note   CSN: [id number] Arrival date & time: 03/04/23  1625     History  Chief Complaint  Patient presents with   Abdominal Pain   Post-op Problem    Heidi Bender is a 42 y.o. female With past medical history significant for recent perforated appendicitis with surgical removal 3 weeks ago who presents concern for ongoing right lower quadrant abdominal pain, she endorses having difficulty with bowel movement since this morning, and has not passed any gas.  She denies any diarrhea, fever, chills, she does endorse some abdominal bloating.  She does not report any worsening symptoms but reports the pain has been persistent since it initially arose.   Abdominal Pain      Home Medications Prior to Admission medications   Medication Sig Start Date End Date Taking? Authorizing Provider  oxyCODONE-acetaminophen (PERCOCET/ROXICET) 5-325 MG tablet Take 1 tablet by mouth every 6 (six) hours as needed for severe pain. 03/05/23  Yes Sponseller, Rebekah R, PA-C  amoxicillin-clavulanate (AUGMENTIN) 875-125 MG tablet Take 1 tablet by mouth 2 (two) times daily. 02/14/23   Darnell Level, MD      Allergies    Patient has no known allergies.    Review of Systems   Review of Systems  Gastrointestinal:  Positive for abdominal pain.  All other systems reviewed and are negative.   Physical Exam Updated Vital Signs BP 106/69 (BP Location: Right Arm)   Pulse 75   Temp 98.1 F (36.7 C) (Temporal)   Resp 16   SpO2 97%  Physical Exam Vitals and nursing note reviewed.  Constitutional:      General: She is not in acute distress.    Appearance: Normal appearance.  HENT:     Head: Normocephalic and atraumatic.  Eyes:     General:        Right eye: No discharge.        Left eye: No discharge.  Cardiovascular:     Rate and Rhythm: Normal rate and regular rhythm.     Heart sounds: No murmur heard.    No friction rub. No  gallop.  Pulmonary:     Effort: Pulmonary effort is normal.     Breath sounds: Normal breath sounds.  Abdominal:     General: Bowel sounds are normal.     Palpations: Abdomen is soft.     Comments: Patient does have some focal right lower quadrant tenderness to palpation, postsurgical scars noted on exam  Skin:    General: Skin is warm and dry.     Capillary Refill: Capillary refill takes less than 2 seconds.  Neurological:     Mental Status: She is alert and oriented to person, place, and time.  Psychiatric:        Mood and Affect: Mood normal.        Behavior: Behavior normal.     ED Results / Procedures / Treatments   Labs (all labs ordered are listed, but only abnormal results are displayed) Labs Reviewed  COMPREHENSIVE METABOLIC PANEL - Abnormal; Notable for the following components:      Result Value   Calcium 8.5 (*)    Albumin 3.4 (*)    All other components within normal limits  URINALYSIS, ROUTINE W REFLEX MICROSCOPIC - Abnormal; Notable for the following components:   APPearance HAZY (*)    Protein, ur 30 (*)    Bacteria, UA FEW (*)    All other components within normal  limits  LIPASE, BLOOD  CBC  HCG, SERUM, QUALITATIVE    EKG None  Radiology CT ABDOMEN PELVIS W CONTRAST  Result Date: 03/04/2023 CLINICAL DATA:  Right lower quadrant pain EXAM: CT ABDOMEN AND PELVIS WITH CONTRAST TECHNIQUE: Multidetector CT imaging of the abdomen and pelvis was performed using the standard protocol following bolus administration of intravenous contrast. RADIATION DOSE REDUCTION: This exam was performed according to the departmental dose-optimization program which includes automated exposure control, adjustment of the mA and/or kV according to patient size and/or use of iterative reconstruction technique. CONTRAST:  75mL OMNIPAQUE IOHEXOL 350 MG/ML SOLN COMPARISON:  02/11/2023 FINDINGS: Lower chest: Linear atelectasis in the lung bases. No effusions. Heart is normal size.  Hepatobiliary: Prior cholecystectomy. Diffuse fatty infiltration of the liver. No focal hepatic abnormality. Pancreas: No focal abnormality or ductal dilatation. Spleen: No focal abnormality.  Normal size. Adrenals/Urinary Tract: No adrenal abnormality. No focal renal abnormality. No stones or hydronephrosis. Urinary bladder is unremarkable. Stomach/Bowel: Decreasing inflammation around the appendix. The appendix remains mildly dilated at 11 mm. Appendicolith at the base of the appendix. Several mildly prominent adjacent right lower quadrant mesenteric lymph nodes. Stomach, large and small bowel grossly unremarkable. Vascular/Lymphatic: No evidence of aneurysm or adenopathy. Reproductive: Uterus and right ovary unremarkable. Large cyst in the left ovary measuring 12 x 9.3 cm. This measured 11.4 x 9.2 cm previously. This slight change may reflect slight enlargement versus differences in scan plane. Other: No free fluid or free air. Musculoskeletal: No acute bony abnormality. IMPRESSION: Continued prominence of the appendix measuring 11 mm in diameter. Surrounding inflammation is decreased since prior study. Appendicolith at the base of the appendix. Mildly enlarged adjacent right lower quadrant mesenteric lymph nodes. Continued large septated cyst in the left ovary now measuring up to approximately 12 cm. 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 Recommend GYN consult and consider nonemergent pelvis MRI w/o and w/ contrast if clinically warranted. Note: This recommendation does not apply to premenarchal patients and to those with increased risk (genetic, family history, elevated tumor markers or other high-risk factors) of ovarian cancer. Reference: JACR 2020 Feb; 17(2):248-254 Electronically Signed   By: Charlett Nose M.D.   On: 03/04/2023 22:47    Procedures Procedures    Medications Ordered in ED Medications  iohexol (OMNIPAQUE) 350 MG/ML injection 75 mL (75 mLs Intravenous Contrast Given 03/04/23  2229)  morphine (PF) 4 MG/ML injection 8 mg (8 mg Intravenous Given 03/05/23 0131)    ED Course/ Medical Decision Making/ A&P                                  Medical Decision Making Amount and/or Complexity of Data Reviewed Labs: ordered. Radiology: ordered.  Risk Prescription drug management.   This patient is a 42 y.o. female  who presents to the ED for concern of abdominal pain.   Differential diagnoses prior to evaluation: The emergent differential diagnosis includes, but is not limited to,  The causes of generalized abdominal pain include but are not limited to AAA, mesenteric ischemia, appendicitis, diverticulitis, DKA, gastritis, gastroenteritis, AMI, nephrolithiasis, pancreatitis, peritonitis, adrenal insufficiency,lead poisoning, iron toxicity, intestinal ischemia, constipation, UTI,SBO/LBO, splenic rupture, biliary disease, IBD, IBS, PUD, or hepatitis. --Overall with high clinical suspicion for complication related to recent surgery, abscess, this is not an exhaustive differential.   Past Medical History / Co-morbidities / Social History: Ovarian cyst  Additional history: Chart reviewed. Pertinent results include: I extensively  reviewed the lab work, imaging from recent hospital admission, as well as surgical operative note from recent admission for perforated appendicitis.  The operative note does note that only a portion of the appendix was removed due to significant inflammation and other difficulties during surgery  Physical Exam: Physical exam performed. The pertinent findings include: Patient does have some focal right lower quadrant tenderness to palpation, some guarding, no rebound, rigidity  Lab Tests/Imaging studies: I personally interpreted labs/imaging and the pertinent results include:   Lab work-reassuring, CMP unremarkable, UA with few bacteria but no nitrates, leukocytes, some squamous cell epithelial contamination, suspect no evidence of UTI, CBC notably  with no leukocytosis, serum lipase normal.  Pregnancy test negative..  I independently interpreted CT abdomen pelvis with contrast which shows ongoing inflammation of the appendix, no evidence of abscess, the surrounding inflammation is overall improved compared to recent evaluation I agree with the radiologist interpretation.   Consults: Requesting consultation with surgery for planning based on ongoing abdominal pain with evidence of appendix still remaining on CT.  Care of Cesar Musacchia transferred to [facility] and Dr. Pilar Plate at the end of my shift as the patient will require reassessment once labs/imaging have resulted. Patient presentation, ED course, and plan of care discussed with review of all pertinent labs and imaging. Please see his/her note for further details regarding further ED course and disposition. Plan at time of handoff is pending surgery consultation, patient may require readmission for appendectomy, versus possible outpatient follow-up with surgery if they do not think that surgery is warranted at this time.  Consult call pending. This may be altered or completely changed at the discretion of the oncoming team pending results of further workup.   Final Clinical Impression(s) / ED Diagnoses Final diagnoses:  Abdominal pain, unspecified abdominal location    Rx / DC Orders ED Discharge Orders          Ordered    oxyCODONE-acetaminophen (PERCOCET/ROXICET) 5-325 MG tablet  Every 6 hours PRN        03/05/23 0212              [place] 03/05/23 1501    Gwyneth Sprout, MD 03/11/23 1621

## 2023-03-04 NOTE — ED Provider Notes (Incomplete)
EMERGENCY DEPARTMENT AT [facility] Provider Note   CSN: [id number] Arrival date & time: 03/04/23  1625     History {Add pertinent medical, surgical, social history, OB history to HPI:1} Chief Complaint  Patient presents with  . Abdominal Pain  . Post-op Problem    Majorie Allanson is a 42 y.o. female With past medical history significant for recent perforated appendicitis with surgical removal 3 weeks ago who presents concern for ongoing right lower quadrant abdominal pain, she endorses having difficulty with bowel movement since this morning, and has not passed any gas.  She denies any diarrhea, fever, chills, she does endorse some abdominal bloating.  She does not report any worsening symptoms but reports the pain has been persistent since it initially arose.   Abdominal Pain      Home Medications Prior to Admission medications   Medication Sig Start Date End Date Taking? Authorizing Provider  amoxicillin-clavulanate (AUGMENTIN) 875-125 MG tablet Take 1 tablet by mouth 2 (two) times daily. 02/14/23   Darnell Level, MD      Allergies    Patient has no known allergies.    Review of Systems   Review of Systems  Gastrointestinal:  Positive for abdominal pain.  All other systems reviewed and are negative.   Physical Exam Updated Vital Signs BP 103/73 (BP Location: Right Arm)   Pulse 74   Temp (!) 97.4 F (36.3 C) (Oral)   Resp 18   SpO2 99%  Physical Exam Vitals and nursing note reviewed.  Constitutional:      General: She is not in acute distress.    Appearance: Normal appearance.  HENT:     Head: Normocephalic and atraumatic.  Eyes:     General:        Right eye: No discharge.        Left eye: No discharge.  Cardiovascular:     Rate and Rhythm: Normal rate and regular rhythm.     Heart sounds: No murmur heard.    No friction rub. No gallop.  Pulmonary:     Effort: Pulmonary effort is normal.     Breath sounds: Normal breath sounds.   Abdominal:     General: Bowel sounds are normal.     Palpations: Abdomen is soft.     Comments: Patient does have some focal right lower quadrant tenderness to palpation, postsurgical scars noted on exam  Skin:    General: Skin is warm and dry.     Capillary Refill: Capillary refill takes less than 2 seconds.  Neurological:     Mental Status: She is alert and oriented to person, place, and time.  Psychiatric:        Mood and Affect: Mood normal.        Behavior: Behavior normal.     ED Results / Procedures / Treatments   Labs (all labs ordered are listed, but only abnormal results are displayed) Labs Reviewed  COMPREHENSIVE METABOLIC PANEL - Abnormal; Notable for the following components:      Result Value   Calcium 8.5 (*)    Albumin 3.4 (*)    All other components within normal limits  URINALYSIS, ROUTINE W REFLEX MICROSCOPIC - Abnormal; Notable for the following components:   APPearance HAZY (*)    Protein, ur 30 (*)    Bacteria, UA FEW (*)    All other components within normal limits  LIPASE, BLOOD  CBC  HCG, SERUM, QUALITATIVE    EKG None  Radiology  No results found.  Procedures Procedures  {Document cardiac monitor, telemetry assessment procedure when appropriate:1}  Medications Ordered in ED Medications - No data to display  ED Course/ Medical Decision Making/ A&P    {   Click here for ABCD2, HEART and other calculatorsREFRESH Note before signing :1}                              Medical Decision Making Amount and/or Complexity of Data Reviewed Labs: ordered. Radiology: ordered.   This patient is a 42 y.o. female  who presents to the ED for concern of abdominal pain.   Differential diagnoses prior to evaluation: The emergent differential diagnosis includes, but is not limited to,  The causes of generalized abdominal pain include but are not limited to AAA, mesenteric ischemia, appendicitis, diverticulitis, DKA, gastritis, gastroenteritis, AMI,  nephrolithiasis, pancreatitis, peritonitis, adrenal insufficiency,lead poisoning, iron toxicity, intestinal ischemia, constipation, UTI,SBO/LBO, splenic rupture, biliary disease, IBD, IBS, PUD, or hepatitis. --Overall with high clinical suspicion for complication related to recent surgery, abscess, this is not an exhaustive differential.   Past Medical History / Co-morbidities / Social History: Ovarian cyst  Additional history: Chart reviewed. Pertinent results include: I extensively reviewed the lab work, imaging from recent hospital admission, as well as surgical operative note from recent admission for perforated appendicitis.  The operative note does note that only a portion of the appendix was removed due to significant inflammation and other difficulties during surgery  Physical Exam: Physical exam performed. The pertinent findings include: Patient does have some focal right lower quadrant tenderness to palpation, some guarding, no rebound, rigidity  Lab Tests/Imaging studies: I personally interpreted labs/imaging and the pertinent results include:   Lab work-reassuring, CMP unremarkable, UA with few bacteria but no nitrates, leukocytes, some squamous cell epithelial contamination, suspect no evidence of UTI, CBC notably with no leukocytosis, serum lipase normal.  Pregnancy test negative..  I independently interpreted CT abdomen pelvis with contrast which shows ongoing inflammation of the appendix, no evidence of abscess, the surrounding inflammation is overall improved compared to recent evaluation I agree with the radiologist interpretation.  Cardiac monitoring: EKG obtained and interpreted by myself and attending physician which shows: ***   Medications: I ordered medication including ***.  I have reviewed the patients home medicines and have made adjustments as needed.   Disposition: After consideration of the diagnostic results and the patients response to treatment, I feel that *** .    ***emergency department workup does not suggest an emergent condition requiring admission or immediate intervention beyond what has been performed at this time. The plan is: ***. The patient is safe for discharge and has been instructed to return immediately for worsening symptoms, change in symptoms or any other concerns.  Final Clinical Impression(s) / ED Diagnoses Final diagnoses:  None    Rx / DC Orders ED Discharge Orders     None

## 2023-03-05 MED ORDER — MORPHINE SULFATE (PF) 4 MG/ML IV SOLN
8.0000 mg | Freq: Once | INTRAVENOUS | Status: AC
  Administered 2023-03-05: 8 mg via INTRAVENOUS
  Filled 2023-03-05: qty 2

## 2023-03-05 MED ORDER — OXYCODONE-ACETAMINOPHEN 5-325 MG PO TABS: 1.0000 | ORAL_TABLET | Freq: Four times a day (QID) | ORAL | 0 refills | Status: DC | PRN

## 2023-03-05 NOTE — Discharge Instructions (Signed)
You were evaluated in the Emergency Department and after careful evaluation, we did not find any emergent condition requiring admission or further testing in the hospital.  Your exam/testing today is overall reassuring.  Follow-up with your surgeon.  Please return to the Emergency Department if you experience any worsening of your condition.   Thank you for allowing [place] to be a part of your care.

## 2023-03-05 NOTE — ED Provider Notes (Signed)
  Physical Exam  BP 106/69 (BP Location: Right Arm)   Pulse 75   Temp 98.1 F (36.7 C) (Temporal)   Resp 16   SpO2 97%   Physical Exam  Procedures  Procedures  ED Course / MDM    Medical Decision Making Amount and/or Complexity of Data Reviewed Labs: ordered. Radiology: ordered.  Risk Prescription drug management.    Care of this patient assumed from preceding ED provider Luther Hearing, PA-C at time of shift change.  Please see his associated note for further insight and the patient's ED course.  In brief patient is a 42-year-old female who had appendectomy earlier this month with Dr. Janee Morn for acute sinusitis, however due to inflammatory changes and partial appendectomy was performed.  Presents today with persistent right lower belly pain without fevers chills nausea vomiting or diarrhea.     Dementia change pending consult to surgery who happens to be Dr. Janee Morn to review CT which revealed persistently inflamed appendix though with improvement inflammatory changes  Consult to Dr. Janee Morn, surgeon who states that plan is to wait a couple of months rather than a couple of weeks to complete appendectomy.  Recommends pain control and referral back to the office for follow-up.  Patient reevaluated with improvement in her pain following IV analgesia.  She is amenable to plan for discharge home and outpatient follow-up.  Clinical concern for emergent underlying etiology of her symptoms that would warrant further ED workup or inpatient management is exceedingly low.  Jenafer voiced understanding of her medical evaluation and treatment plan. Each of their questions answered to their expressed satisfaction.  Return precautions were given.  Patient is well-appearing, stable, and was discharged in good condition.  This chart was dictated using voice recognition software, Dragon. Despite the best efforts of this provider to proofread and correct errors, errors may still occur  which can change documentation meaning.     Paris Lore, PA-C 03/05/23 0518    Sabas Sous, MD 03/05/23 [phone]

## 2023-03-24 ENCOUNTER — Encounter (HOSPITAL_COMMUNITY): Payer: Self-pay

## 2023-03-24 ENCOUNTER — Encounter (HOSPITAL_COMMUNITY): Payer: Self-pay | Admitting: Surgery

## 2023-03-24 ENCOUNTER — Inpatient Hospital Stay (HOSPITAL_COMMUNITY)
Admission: RE | Admit: 2023-03-24 | Discharge: 2023-03-30 | DRG: 330 | Disposition: A | Discharge status: Home / Self Care | Payer: BLUE CROSS/BLUE SHIELD | Source: Ambulatory Visit | Attending: Surgery | Admitting: Surgery

## 2023-03-24 ENCOUNTER — Other Ambulatory Visit: Payer: Self-pay

## 2023-03-24 DIAGNOSIS — K660 Peritoneal adhesions (postprocedural) (postinfection): Secondary | ICD-10-CM | POA: Diagnosis present

## 2023-03-24 DIAGNOSIS — Z5331 Laparoscopic surgical procedure converted to open procedure: Secondary | ICD-10-CM

## 2023-03-24 DIAGNOSIS — K36 Other appendicitis: Principal | ICD-10-CM | POA: Diagnosis present

## 2023-03-24 DIAGNOSIS — Z9049 Acquired absence of other specified parts of digestive tract: Secondary | ICD-10-CM

## 2023-03-24 DIAGNOSIS — G8918 Other acute postprocedural pain: Principal | ICD-10-CM | POA: Diagnosis present

## 2023-03-24 DIAGNOSIS — K567 Ileus, unspecified: Secondary | ICD-10-CM | POA: Diagnosis not present

## 2023-03-24 DIAGNOSIS — K381 Appendicular concretions: Secondary | ICD-10-CM | POA: Diagnosis present

## 2023-03-24 DIAGNOSIS — N83202 Unspecified ovarian cyst, left side: Secondary | ICD-10-CM | POA: Diagnosis present

## 2023-03-24 MED ORDER — METHOCARBAMOL 500 MG PO TABS: 500.0000 mg | ORAL_TABLET | Freq: Three times a day (TID) | ORAL | Status: DC | PRN

## 2023-03-24 MED ORDER — ONDANSETRON 4 MG PO TBDP: 4.0000 mg | ORAL_TABLET | Freq: Four times a day (QID) | ORAL | Status: DC | PRN

## 2023-03-24 MED ORDER — OXYCODONE HCL 5 MG PO TABS
5.0000 mg | ORAL_TABLET | ORAL | Status: DC | PRN
  Filled 2023-03-24: qty 1

## 2023-03-24 MED ORDER — ENOXAPARIN SODIUM 40 MG/0.4ML IJ SOSY
40.0000 mg | PREFILLED_SYRINGE | INTRAMUSCULAR | Status: DC
  Administered 2023-03-24 – 2023-03-25 (×2): 40 mg via SUBCUTANEOUS
  Filled 2023-03-24 (×2): qty 0.4

## 2023-03-24 MED ORDER — DIPHENHYDRAMINE HCL 50 MG/ML IJ SOLN: 25.0000 mg | Freq: Four times a day (QID) | INTRAMUSCULAR | Status: DC | PRN

## 2023-03-24 MED ORDER — ONDANSETRON HCL 4 MG/2ML IJ SOLN
4.0000 mg | Freq: Four times a day (QID) | INTRAMUSCULAR | Status: DC | PRN
  Administered 2023-03-27: 4 mg via INTRAVENOUS
  Filled 2023-03-24: qty 2

## 2023-03-24 MED ORDER — SODIUM CHLORIDE 0.9 % IV SOLN
2.0000 g | INTRAVENOUS | Status: DC
  Administered 2023-03-24 – 2023-03-25 (×2): 2 g via INTRAVENOUS
  Filled 2023-03-24 (×2): qty 20

## 2023-03-24 MED ORDER — MELATONIN 3 MG PO TABS: 3.0000 mg | ORAL_TABLET | Freq: Every evening | ORAL | Status: DC | PRN

## 2023-03-24 MED ORDER — METHOCARBAMOL 1000 MG/10ML IJ SOLN: 500.0000 mg | Freq: Three times a day (TID) | INTRAVENOUS | Status: DC | PRN

## 2023-03-24 MED ORDER — IBUPROFEN 600 MG PO TABS
600.0000 mg | ORAL_TABLET | Freq: Three times a day (TID) | ORAL | Status: DC
  Administered 2023-03-24 – 2023-03-30 (×16): 600 mg via ORAL
  Filled 2023-03-24 (×17): qty 1

## 2023-03-24 MED ORDER — ACETAMINOPHEN 500 MG PO TABS
1000.0000 mg | ORAL_TABLET | Freq: Four times a day (QID) | ORAL | Status: DC
  Administered 2023-03-24 – 2023-03-30 (×19): 1000 mg via ORAL
  Filled 2023-03-24 (×20): qty 2

## 2023-03-24 MED ORDER — DIPHENHYDRAMINE HCL 25 MG PO CAPS: 25.0000 mg | ORAL_CAPSULE | Freq: Four times a day (QID) | ORAL | Status: DC | PRN

## 2023-03-24 MED ORDER — METRONIDAZOLE 500 MG/100ML IV SOLN
500.0000 mg | Freq: Two times a day (BID) | INTRAVENOUS | Status: DC
  Administered 2023-03-24 – 2023-03-26 (×4): 500 mg via INTRAVENOUS
  Filled 2023-03-24 (×4): qty 100

## 2023-03-24 MED ORDER — HYDROMORPHONE HCL 1 MG/ML IJ SOLN: 0.5000 mg | INTRAMUSCULAR | Status: DC | PRN

## 2023-03-24 MED ORDER — DOCUSATE SODIUM 100 MG PO CAPS
100.0000 mg | ORAL_CAPSULE | Freq: Two times a day (BID) | ORAL | Status: DC
  Administered 2023-03-24 – 2023-03-30 (×11): 100 mg via ORAL
  Filled 2023-03-24 (×12): qty 1

## 2023-03-24 MED ORDER — POLYETHYLENE GLYCOL 3350 17 G PO PACK: 17.0000 g | PACK | Freq: Every day | ORAL | Status: DC | PRN

## 2023-03-24 NOTE — Plan of Care (Signed)

## 2023-03-25 DIAGNOSIS — K660 Peritoneal adhesions (postprocedural) (postinfection): Secondary | ICD-10-CM | POA: Diagnosis present

## 2023-03-25 DIAGNOSIS — Z9049 Acquired absence of other specified parts of digestive tract: Secondary | ICD-10-CM | POA: Diagnosis not present

## 2023-03-25 DIAGNOSIS — K36 Other appendicitis: Secondary | ICD-10-CM | POA: Diagnosis present

## 2023-03-25 DIAGNOSIS — Z5331 Laparoscopic surgical procedure converted to open procedure: Secondary | ICD-10-CM | POA: Diagnosis not present

## 2023-03-25 DIAGNOSIS — K567 Ileus, unspecified: Secondary | ICD-10-CM | POA: Diagnosis not present

## 2023-03-25 DIAGNOSIS — N83202 Unspecified ovarian cyst, left side: Secondary | ICD-10-CM | POA: Diagnosis present

## 2023-03-25 DIAGNOSIS — K381 Appendicular concretions: Secondary | ICD-10-CM | POA: Diagnosis present

## 2023-03-25 LAB — BASIC METABOLIC PANEL
Anion gap: 11 (ref 5–15)
BUN: 8 mg/dL (ref 6–20)
CO2: 23 mmol/L (ref 22–32)
Calcium: 8.4 mg/dL — ABNORMAL LOW (ref 8.9–10.3)
Chloride: 103 mmol/L (ref 98–111)
Creatinine, Ser: 0.75 mg/dL (ref 0.44–1.00)
GFR, Estimated: >60 mL/min (ref >60)
Glucose, Bld: 102 mg/dL — ABNORMAL HIGH (ref 70–99)
Potassium: 3.6 mmol/L (ref 3.5–5.1)
Sodium: 137 mmol/L (ref 135–145)

## 2023-03-25 LAB — ABO/RH: ABO/RH(D): O POS

## 2023-03-25 LAB — CBC
HCT: 35.5 % — ABNORMAL LOW (ref 36.0–46.0)
Hemoglobin: 11.2 g/dL — ABNORMAL LOW (ref 12.0–15.0)
MCH: 26.9 pg (ref 26.0–34.0)
MCHC: 31.5 g/dL (ref 30.0–36.0)
MCV: 85.3 fL (ref 80.0–100.0)
Platelets: 312 10*3/uL (ref 150–400)
RBC: 4.16 MIL/uL (ref 3.87–5.11)
RDW: 14.4 % (ref 11.5–15.5)
WBC: 6.2 10*3/uL (ref 4.0–10.5)
nRBC: 0 % (ref 0.0–0.2)

## 2023-03-25 LAB — TYPE AND SCREEN
ABO/RH(D): O POS
Antibody Screen: NEGATIVE

## 2023-03-25 LAB — HCG, SERUM, QUALITATIVE: Preg, Serum: NEGATIVE

## 2023-03-25 MED ORDER — CHLORHEXIDINE GLUCONATE CLOTH 2 % EX PADS
6.0000 | MEDICATED_PAD | Freq: Once | CUTANEOUS | Status: AC
  Administered 2023-03-26: 6 via TOPICAL

## 2023-03-25 MED ORDER — CHLORHEXIDINE GLUCONATE CLOTH 2 % EX PADS
6.0000 | MEDICATED_PAD | Freq: Once | CUTANEOUS | Status: AC
  Administered 2023-03-25: 6 via TOPICAL

## 2023-03-25 NOTE — Progress Notes (Signed)
       Subjective: Patient feeling much better today.  Frustrated overall with getting better with abx and then worse when they stop, etc.  She is eating solid diet with no issues.  ROS: See above, otherwise other systems negative  Objective: Vital signs in last 24 hours: Temp:  [97.5 F (36.4 C)-98.4 F (36.9 C)] 97.9 F (36.6 C) (09/12 0901) Pulse Rate:  [56-76] 68 (09/12 0901) Resp:  [17-18] 17 (09/12 0901) BP: (92-116)/(69-82) 115/78 (09/12 0901) SpO2:  [98 %-100 %] 100 % (09/12 0901) Last BM Date : 03/25/23  Intake/Output from previous day: No intake/output data recorded. Intake/Output this shift: Total I/O In: 200.8 [IV Piggyback:200.8] Out: -   PE: Gen: NAD Abd: soft, obese, NT, ND  Lab Results:  Recent Labs    03/25/23 0223  WBC 6.2  HGB 11.2*  HCT 35.5*  PLT 312   BMET Recent Labs    03/25/23 0223  NA 137  K 3.6  CL 103  CO2 23  GLUCOSE 102*  BUN 8  CREATININE 0.75  CALCIUM 8.4*   PT/INR No results for input(s): "LABPROT", "INR" in the last 72 hours. CMP     Component Value Date/Time   NA 137 03/25/2023 0223   K 3.6 03/25/2023 0223   CL 103 03/25/2023 0223   CO2 23 03/25/2023 0223   GLUCOSE 102 (H) 03/25/2023 0223   BUN 8 03/25/2023 0223   CREATININE 0.75 03/25/2023 0223   CALCIUM 8.4 (L) 03/25/2023 0223   PROT 7.5 03/04/2023 1703   ALBUMIN 3.4 (L) 03/04/2023 1703   AST 24 03/04/2023 1703   ALT 34 03/04/2023 1703   ALKPHOS 65 03/04/2023 1703   BILITOT 0.6 03/04/2023 1703   GFRNONAA >60 03/25/2023 0223   Lipase     Component Value Date/Time   LIPASE 23 03/04/2023 1703       Studies/Results: No results found.  Anti-infectives: Anti-infectives (From admission, onward)    Start     Dose/Rate Route Frequency Ordered Stop   03/24/23 2000  cefTRIAXone (ROCEPHIN) 2 g in sodium chloride 0.9 % 100 mL IVPB       Placed in "And" Linked Group   2 g 200 mL/hr over 30 Minutes Intravenous Every 24 hours 03/24/23 1903 03/31/23 1959    03/24/23 2000  metroNIDAZOLE (FLAGYL) IVPB 500 mg       Placed in "And" Linked Group   500 mg 100 mL/hr over 60 Minutes Intravenous Every 12 hours 03/24/23 1903 03/31/23 2359        Assessment/Plan S/p lap appy for perforated appendicitis 02/11/23 by Dr. Janee Morn -patient with continued off and on pain since surgery.  She has never had an abscess, but she continued to have inflammation and dilatation of the appendiceal stump.  On her imaging from here on 8/22 she has an appendicolith at the base of her appendix. -awaiting power share of imaging from [facility] -cont abx for now. -we are going to further discuss treatment options, whether it is more conservative management with abx therapy vs a repeat lap appy to remove the stump of her appendix.  FEN - regular VTE - Lovenox ID - Rocephin/Flagyl  Ovarian cyst - GYN follow up   LOS: 0 days    Letha Cape , [facility] Surgery 03/25/2023, 10:36 AM Please see Amion for pager number during day hours 7:00am-4:30pm or 7:00am -11:30am on weekends

## 2023-03-25 NOTE — Plan of Care (Signed)

## 2023-03-25 NOTE — Plan of Care (Signed)

## 2023-03-25 NOTE — H&P (Signed)
Heidi Bender 1981-02-07  [id number].    Chief Complaint/Reason for Consult: Abdominal pain s/p lap appy  HPI:  42 y/o F w/ a left-sided ovarian cyst who underwent a laparoscopic appendectomy on 03/17/23 and now presents with abdominal pain and has a CT showing persistent inflammation in the RLQ.  She initially presented to an OSH, where imaging showed the known ovarian cyst without concern for torsion as well as stranding in the RLQ.  She was transferred to [facility] and arrived in stable condition.  WBC 10.  She is AF and HDS.   ROS: Review of Systems  Constitutional:  Positive for chills.  HENT: Negative.    Eyes: Negative.   Respiratory: Negative.    Cardiovascular: Negative.   Gastrointestinal:  Positive for abdominal pain.  Genitourinary: Negative.   Musculoskeletal: Negative.   Skin: Negative.   Neurological: Negative.   Endo/Heme/Allergies: Negative.   Psychiatric/Behavioral: Negative.      No family history on file.  Past Medical History:  Diagnosis Date   Ovarian cyst     Past Surgical History:  Procedure Laterality Date   CHOLECYSTECTOMY     DILATION AND CURETTAGE OF UTERUS     LAPAROSCOPIC APPENDECTOMY N/A 02/11/2023   Procedure: APPENDECTOMY LAPAROSCOPIC;  Surgeon: Violeta Gelinas, MD;  Location: [facility] OR;  Service: General;  Laterality: N/A;    Social History:  reports that she has never smoked. She has never used smokeless tobacco. She reports that she does not drink alcohol and does not use drugs.  Allergies: No Known Allergies  Medications Prior to Admission  Medication Sig Dispense Refill   amoxicillin-clavulanate (AUGMENTIN) 875-125 MG tablet Take 1 tablet by mouth 2 (two) times daily. 10 tablet 0   oxyCODONE-acetaminophen (PERCOCET/ROXICET) 5-325 MG tablet Take 1 tablet by mouth every 6 (six) hours as needed for severe pain. 15 tablet 0    Physical Exam: Blood pressure 92/69, pulse (!) 58, temperature 98.1 F (36.7 C), temperature source  Oral, resp. rate 17, SpO2 98%. Gen: female, NAD Resp: no increased WOB CV: RRR Abd: soft, non-distended, incisions healed, minimal TTP in the RLQ, no rebound/guarding, no peritoneal signs Neuro: moving all extremities  Results for orders placed or performed during the hospital encounter of 03/24/23 (from the past 48 hour(s))  Basic metabolic panel     Status: Abnormal   Collection Time: 03/25/23  2:23 AM  Result Value Ref Range   Sodium 137 135 - 145 mmol/L   Potassium 3.6 3.5 - 5.1 mmol/L   Chloride 103 98 - 111 mmol/L   CO2 23 22 - 32 mmol/L   Glucose, Bld 102 (H) 70 - 99 mg/dL    Comment: Glucose reference range applies only to samples taken after fasting for at least 8 hours.   BUN 8 6 - 20 mg/dL   Creatinine, Ser 2.84 0.44 - 1.00 mg/dL   Calcium 8.4 (L) 8.9 - 10.3 mg/dL   GFR, Estimated >13 >24 mL/min    Comment: (NOTE) Calculated using the CKD-EPI Creatinine Equation (2021)    Anion gap 11 5 - 15    Comment: Performed at [facility], [address] [place], [place] [address]  CBC     Status: Abnormal   Collection Time: 03/25/23  2:23 AM  Result Value Ref Range   WBC 6.2 4.0 - 10.5 K/uL   RBC 4.16 3.87 - 5.11 MIL/uL   Hemoglobin 11.2 (L) 12.0 - 15.0 g/dL   HCT 72.5 (L) 36.6 -  46.0 %   MCV 85.3 80.0 - 100.0 fL   MCH 26.9 26.0 - 34.0 pg   MCHC 31.5 30.0 - 36.0 g/dL   RDW 16.1 09.6 - 04.5 %   Platelets 312 150 - 400 K/uL   nRBC 0.0 0.0 - 0.2 %    Comment: Performed at [facility], [address] [place], [place] [address]   No results found.  Assessment/Plan 42 y/o F s/p lap appy who presents with abdominal pain and has persistent inflammation in the RLQ without evidence of an abscess  - Admit to surgery - Rocephin/Flagyl - Monitor abdominal exam  FEN - Regular diet VTE - Lovenox ID - Rocephin/Flagyl (9/12-  I reviewed last 24 h vitals and pain scores, last 24 h labs and trends, and last 24 h imaging results.  Tacy Learn Surgery 03/25/2023, 5:44 AM Please see Amion for pager number during day hours 7:00am-4:30pm or 7:00am -11:30am on weekends

## 2023-03-26 ENCOUNTER — Other Ambulatory Visit: Payer: Self-pay

## 2023-03-26 ENCOUNTER — Inpatient Hospital Stay (HOSPITAL_COMMUNITY): Payer: BLUE CROSS/BLUE SHIELD | Admitting: Anesthesiology

## 2023-03-26 ENCOUNTER — Encounter (HOSPITAL_COMMUNITY): Payer: Self-pay | Admitting: Surgery

## 2023-03-26 ENCOUNTER — Encounter (HOSPITAL_COMMUNITY): Admission: RE | Disposition: A | Discharge status: Home / Self Care | Payer: Self-pay | Source: Ambulatory Visit

## 2023-03-26 HISTORY — PX: LAPAROSCOPIC APPENDECTOMY: SHX408

## 2023-03-26 SURGERY — APPENDECTOMY, LAPAROSCOPIC
Anesthesia: General

## 2023-03-26 MED ORDER — MIDAZOLAM HCL 2 MG/2ML IJ SOLN
INTRAMUSCULAR | Status: AC
  Filled 2023-03-26: qty 2

## 2023-03-26 MED ORDER — ACETAMINOPHEN 10 MG/ML IV SOLN
INTRAVENOUS | Status: DC | PRN
Start: 2023-03-26 — End: 2023-03-26
  Administered 2023-03-26: 1000 mg via INTRAVENOUS

## 2023-03-26 MED ORDER — PROPOFOL 10 MG/ML IV BOLUS
INTRAVENOUS | Status: AC
  Filled 2023-03-26: qty 20

## 2023-03-26 MED ORDER — ONDANSETRON HCL 4 MG/2ML IJ SOLN: INTRAMUSCULAR | Status: DC | PRN

## 2023-03-26 MED ORDER — LACTATED RINGERS IV SOLN: INTRAVENOUS | Status: DC

## 2023-03-26 MED ORDER — DEXAMETHASONE SODIUM PHOSPHATE 10 MG/ML IJ SOLN
INTRAMUSCULAR | Status: DC | PRN
  Administered 2023-03-26: 5 mg via INTRAVENOUS

## 2023-03-26 MED ORDER — HYDROMORPHONE HCL 1 MG/ML IJ SOLN
INTRAMUSCULAR | Status: AC
  Administered 2023-03-26: 0.5 mg via INTRAVENOUS
  Filled 2023-03-26: qty 1

## 2023-03-26 MED ORDER — ROCURONIUM BROMIDE 10 MG/ML (PF) SYRINGE
PREFILLED_SYRINGE | INTRAVENOUS | Status: DC | PRN
  Administered 2023-03-26: 10 mg via INTRAVENOUS
  Administered 2023-03-26: 60 mg via INTRAVENOUS
  Administered 2023-03-26: 30 mg via INTRAVENOUS

## 2023-03-26 MED ORDER — MIDAZOLAM HCL 2 MG/2ML IJ SOLN
INTRAMUSCULAR | Status: DC | PRN
  Administered 2023-03-26: 2 mg via INTRAVENOUS

## 2023-03-26 MED ORDER — DROPERIDOL 2.5 MG/ML IJ SOLN: 0.6250 mg | Freq: Once | INTRAMUSCULAR | Status: DC | PRN

## 2023-03-26 MED ORDER — DEXMEDETOMIDINE HCL IN NACL 80 MCG/20ML IV SOLN
INTRAVENOUS | Status: DC | PRN
Start: 2023-03-26 — End: 2023-03-26
  Administered 2023-03-26 (×2): 8 ug via INTRAVENOUS

## 2023-03-26 MED ORDER — ACETAMINOPHEN 10 MG/ML IV SOLN
INTRAVENOUS | Status: AC
  Filled 2023-03-26: qty 100

## 2023-03-26 MED ORDER — FENTANYL CITRATE (PF) 250 MCG/5ML IJ SOLN
INTRAMUSCULAR | Status: DC | PRN
  Administered 2023-03-26: 100 ug via INTRAVENOUS
  Administered 2023-03-26 (×3): 50 ug via INTRAVENOUS

## 2023-03-26 MED ORDER — PROPOFOL 10 MG/ML IV BOLUS
INTRAVENOUS | Status: DC | PRN
  Administered 2023-03-26: 200 mg via INTRAVENOUS

## 2023-03-26 MED ORDER — HYDROMORPHONE HCL 1 MG/ML IJ SOLN
0.2500 mg | INTRAMUSCULAR | Status: DC | PRN
  Administered 2023-03-26: 0.5 mg via INTRAVENOUS

## 2023-03-26 MED ORDER — BUPIVACAINE-EPINEPHRINE (PF) 0.5% -1:200000 IJ SOLN
INTRAMUSCULAR | Status: AC
  Filled 2023-03-26: qty 30

## 2023-03-26 MED ORDER — CHLORHEXIDINE GLUCONATE 0.12 % MT SOLN
15.0000 mL | Freq: Once | OROMUCOSAL | Status: AC
  Administered 2023-03-26: 15 mL via OROMUCOSAL

## 2023-03-26 MED ORDER — METRONIDAZOLE 500 MG/100ML IV SOLN
500.0000 mg | Freq: Two times a day (BID) | INTRAVENOUS | Status: AC
  Administered 2023-03-26 – 2023-03-28 (×4): 500 mg via INTRAVENOUS
  Filled 2023-03-26 (×4): qty 100

## 2023-03-26 MED ORDER — PROMETHAZINE HCL 25 MG/ML IJ SOLN: 6.2500 mg | INTRAMUSCULAR | Status: DC | PRN

## 2023-03-26 MED ORDER — OXYCODONE HCL 5 MG PO TABS
5.0000 mg | ORAL_TABLET | ORAL | Status: DC | PRN
  Administered 2023-03-26: 10 mg via ORAL
  Filled 2023-03-26: qty 2
  Filled 2023-03-26: qty 1
  Filled 2023-03-26: qty 2

## 2023-03-26 MED ORDER — BUPIVACAINE-EPINEPHRINE 0.5% -1:200000 IJ SOLN
INTRAMUSCULAR | Status: DC | PRN
Start: 2023-03-26 — End: 2023-03-26
  Administered 2023-03-26: 8 mL

## 2023-03-26 MED ORDER — SUGAMMADEX SODIUM 200 MG/2ML IV SOLN
INTRAVENOUS | Status: DC | PRN
  Administered 2023-03-26: 200 mg via INTRAVENOUS

## 2023-03-26 MED ORDER — SODIUM CHLORIDE 0.9 % IV SOLN
2.0000 g | INTRAVENOUS | Status: AC
  Administered 2023-03-26 – 2023-03-27 (×2): 2 g via INTRAVENOUS
  Filled 2023-03-26 (×2): qty 20

## 2023-03-26 MED ORDER — ALBUMIN HUMAN 5 % IV SOLN: INTRAVENOUS | Status: DC | PRN

## 2023-03-26 MED ORDER — FENTANYL CITRATE (PF) 250 MCG/5ML IJ SOLN
INTRAMUSCULAR | Status: AC
  Filled 2023-03-26: qty 5

## 2023-03-26 MED ORDER — LIDOCAINE 2% (20 MG/ML) 5 ML SYRINGE
INTRAMUSCULAR | Status: DC | PRN
  Administered 2023-03-26: 100 mg via INTRAVENOUS

## 2023-03-26 MED ORDER — ONDANSETRON HCL 4 MG/2ML IJ SOLN
INTRAMUSCULAR | Status: DC | PRN
  Administered 2023-03-26: 4 mg via INTRAVENOUS

## 2023-03-26 MED ORDER — HYDROMORPHONE HCL 1 MG/ML IJ SOLN
0.5000 mg | INTRAMUSCULAR | Status: DC | PRN
  Administered 2023-03-26 (×2): 1 mg via INTRAVENOUS
  Administered 2023-03-27 (×2): 2 mg via INTRAVENOUS
  Filled 2023-03-26: qty 2
  Filled 2023-03-26: qty 1
  Filled 2023-03-26: qty 2
  Filled 2023-03-26: qty 1

## 2023-03-26 MED ORDER — ORAL CARE MOUTH RINSE: 15.0000 mL | Freq: Once | OROMUCOSAL | Status: AC

## 2023-03-26 MED ORDER — ENOXAPARIN SODIUM 40 MG/0.4ML IJ SOSY
40.0000 mg | PREFILLED_SYRINGE | INTRAMUSCULAR | Status: DC
  Administered 2023-03-27 – 2023-03-29 (×3): 40 mg via SUBCUTANEOUS
  Filled 2023-03-26 (×3): qty 0.4

## 2023-03-26 SURGICAL SUPPLY — 65 items
ADH SKN CLS APL DERMABOND .7 (GAUZE/BANDAGES/DRESSINGS) ×1
APL PRP STRL LF DISP 70% ISPRP (MISCELLANEOUS) ×1
APPLIER CLIP 5 13 M/L LIGAMAX5 (MISCELLANEOUS) ×1
APR CLP MED LRG 5 ANG JAW (MISCELLANEOUS) ×1
BAG COUNTER SPONGE SURGICOUNT (BAG) ×2 IMPLANT
BAG SPNG CNTER NS LX DISP (BAG) ×1
BLADE CLIPPER SURG (BLADE) IMPLANT
CANISTER SUCT 3000ML PPV (MISCELLANEOUS) ×2 IMPLANT
CHLORAPREP W/TINT 26 (MISCELLANEOUS) ×2 IMPLANT
CLIP APPLIE 5 13 M/L LIGAMAX5 (MISCELLANEOUS) IMPLANT
COVER SURGICAL LIGHT HANDLE (MISCELLANEOUS) ×2 IMPLANT
CUTTER FLEX LINEAR 45M (STAPLE) IMPLANT
DERMABOND ADVANCED .7 DNX12 (GAUZE/BANDAGES/DRESSINGS) ×2 IMPLANT
DRSG OPSITE POSTOP 4X10 (GAUZE/BANDAGES/DRESSINGS) IMPLANT
ELECT BLADE 4.0 EZ CLEAN MEGAD (MISCELLANEOUS) ×1
ELECT REM PT RETURN 9FT ADLT (ELECTROSURGICAL) ×1
ELECTRODE BLDE 4.0 EZ CLN MEGD (MISCELLANEOUS) IMPLANT
ELECTRODE REM PT RTRN 9FT ADLT (ELECTROSURGICAL) ×2 IMPLANT
ENDOLOOP SUT PDS II 0 18 (SUTURE) IMPLANT
GLOVE BIOGEL PI MICRO STRL 5.5 (GLOVE) ×2 IMPLANT
GLOVE ECLIPSE 7.0 STRL STRAW (GLOVE) IMPLANT
GLOVE INDICATOR 7.5 STRL GRN (GLOVE) IMPLANT
GLOVE SURG UNDER POLY LF SZ6 (GLOVE) ×2 IMPLANT
GOWN STRL REUS W/ TWL LRG LVL3 (GOWN DISPOSABLE) ×6 IMPLANT
GOWN STRL REUS W/TWL LRG LVL3 (GOWN DISPOSABLE) ×3
HANDLE SUCTION POOLE (INSTRUMENTS) IMPLANT
IRRIG SUCT STRYKERFLOW 2 WTIP (MISCELLANEOUS) ×1
IRRIGATION SUCT STRKRFLW 2 WTP (MISCELLANEOUS) IMPLANT
KIT BASIN OR (CUSTOM PROCEDURE TRAY) ×2 IMPLANT
KIT TURNOVER KIT B (KITS) ×2 IMPLANT
NS IRRIG 1000ML POUR BTL (IV SOLUTION) ×2 IMPLANT
PAD ARMBOARD 7.5X6 YLW CONV (MISCELLANEOUS) ×4 IMPLANT
PENCIL BUTTON HOLSTER BLD 10FT (ELECTRODE) ×2 IMPLANT
RELOAD PROXIMATE 75MM BLUE (ENDOMECHANICALS) ×1 IMPLANT
RELOAD STAPLE 45 3.5 BLU ETS (ENDOMECHANICALS) IMPLANT
RELOAD STAPLE 75 3.8 BLU REG (ENDOMECHANICALS) IMPLANT
RELOAD STAPLE TA45 3.5 REG BLU (ENDOMECHANICALS) ×1 IMPLANT
RETRACTOR WND ALEXIS 25 LRG (MISCELLANEOUS) IMPLANT
RTRCTR WOUND ALEXIS 25CM LRG (MISCELLANEOUS) ×1
SCISSORS LAP 5X35 DISP (ENDOMECHANICALS) IMPLANT
SET TUBE SMOKE EVAC HIGH FLOW (TUBING) ×2 IMPLANT
SHEARS HARMONIC ACE PLUS 36CM (ENDOMECHANICALS) IMPLANT
SLEEVE Z-THREAD 5X100MM (TROCAR) ×2 IMPLANT
SPECIMEN JAR SMALL (MISCELLANEOUS) ×2 IMPLANT
SPONGE T-LAP 18X18 ~~LOC~~+RFID (SPONGE) IMPLANT
STAPLER GUN LINEAR PROX 60 (STAPLE) IMPLANT
STAPLER PROXIMATE 75MM BLUE (STAPLE) IMPLANT
SUCTION POOLE HANDLE (INSTRUMENTS) ×1
SUT MNCRL AB 4-0 PS2 18 (SUTURE) ×2 IMPLANT
SUT PDS AB 1 TP1 96 (SUTURE) IMPLANT
SUT SILK 3 0 SH CR/8 (SUTURE) IMPLANT
SUT VIC AB 3-0 SH 27 (SUTURE) ×1
SUT VIC AB 3-0 SH 27X BRD (SUTURE) IMPLANT
SYS BAG RETRIEVAL 10MM (BASKET) ×1
SYSTEM BAG RETRIEVAL 10MM (BASKET) ×2 IMPLANT
TOWEL GREEN STERILE (TOWEL DISPOSABLE) ×2 IMPLANT
TOWEL GREEN STERILE FF (TOWEL DISPOSABLE) ×2 IMPLANT
TRAY FOLEY W/BAG SLVR 14FR (SET/KITS/TRAYS/PACK) IMPLANT
TRAY LAPAROSCOPIC MC (CUSTOM PROCEDURE TRAY) ×2 IMPLANT
TROCAR BALLN 12MMX100 BLUNT (TROCAR) ×2 IMPLANT
TROCAR Z-THREAD OPTICAL 5X100M (TROCAR) ×2 IMPLANT
TUBE CONNECTING 12X1/4 (SUCTIONS) IMPLANT
WARMER LAPAROSCOPE (MISCELLANEOUS) ×2 IMPLANT
WATER STERILE IRR 1000ML POUR (IV SOLUTION) ×2 IMPLANT
YANKAUER SUCT BULB TIP NO VENT (SUCTIONS) IMPLANT

## 2023-03-26 NOTE — Progress Notes (Signed)
    Day of Surgery  Subjective: Denies pain today. Afebrile.   Objective: Vital signs in last 24 hours: Temp:  [97.8 F (36.6 C)-98.4 F (36.9 C)] 98.4 F (36.9 C) (09/13 0827) Pulse Rate:  [54-65] 55 (09/13 0827) Resp:  [17-20] 17 (09/13 0827) BP: (106-129)/(71-90) 110/75 (09/13 0827) SpO2:  [94 %-100 %] 98 % (09/13 0827) Last BM Date : 03/25/23  Intake/Output from previous day: 09/12 0701 - 09/13 0700 In: 1640.8 [P.O.:1440; IV Piggyback:200.8] Out: -  Intake/Output this shift: No intake/output data recorded.  PE: Gen: NAD Neuro: alert and oriented Resp: nonlabored respirations on room air Abd: soft, nondistended, nontender to palpation  Lab Results:  Recent Labs    03/25/23 0223  WBC 6.2  HGB 11.2*  HCT 35.5*  PLT 312   BMET Recent Labs    03/25/23 0223  NA 137  K 3.6  CL 103  CO2 23  GLUCOSE 102*  BUN 8  CREATININE 0.75  CALCIUM 8.4*   PT/INR No results for input(s): "LABPROT", "INR" in the last 72 hours. CMP     Component Value Date/Time   NA 137 03/25/2023 0223   K 3.6 03/25/2023 0223   CL 103 03/25/2023 0223   CO2 23 03/25/2023 0223   GLUCOSE 102 (H) 03/25/2023 0223   BUN 8 03/25/2023 0223   CREATININE 0.75 03/25/2023 0223   CALCIUM 8.4 (L) 03/25/2023 0223   PROT 7.5 03/04/2023 1703   ALBUMIN 3.4 (L) 03/04/2023 1703   AST 24 03/04/2023 1703   ALT 34 03/04/2023 1703   ALKPHOS 65 03/04/2023 1703   BILITOT 0.6 03/04/2023 1703   GFRNONAA >60 03/25/2023 0223   Lipase     Component Value Date/Time   LIPASE 23 03/04/2023 1703       Studies/Results: No results found.  Anti-infectives: Anti-infectives (From admission, onward)    Start     Dose/Rate Route Frequency Ordered Stop   03/24/23 2000  cefTRIAXone (ROCEPHIN) 2 g in sodium chloride 0.9 % 100 mL IVPB       Placed in "And" Linked Group   2 g 200 mL/hr over 30 Minutes Intravenous Every 24 hours 03/24/23 1903 03/31/23 1959   03/24/23 2000  metroNIDAZOLE (FLAGYL) IVPB 500 mg        Placed in "And" Linked Group   500 mg 100 mL/hr over 60 Minutes Intravenous Every 12 hours 03/24/23 1903 03/31/23 2359        Assessment/Plan S/p lap appy for perforated appendicitis 02/11/23 by Dr. Janee Morn -Patient with continued off and on pain since surgery.  She has never had an abscess, but she continued to have inflammation and dilatation of the appendiceal stump.  On her imaging from here on 8/22 she has an appendicolith at the base of her appendix. -Continue IV antibiotics -OR today for laparoscopic completion appendectomy, possible laparotomy, possible ileocecectomy. Informed consent obtained.  Ovarian cyst - GYN follow up   LOS: 1 day   Sophronia Simas, MD [facility] Surgery General, Hepatobiliary and Pancreatic Surgery 03/26/23 9:17 AM

## 2023-03-26 NOTE — Anesthesia Procedure Notes (Signed)
Procedure Name: Intubation Date/Time: 03/26/2023 11:39 AM  Performed by: Sandie Ano, CRNAPre-anesthesia Checklist: Patient identified, Emergency Drugs available, Suction available and Patient being monitored Patient Re-evaluated:Patient Re-evaluated prior to induction Oxygen Delivery Method: Circle System Utilized Preoxygenation: Pre-oxygenation with 100% oxygen Induction Type: IV induction Ventilation: Mask ventilation without difficulty Laryngoscope Size: Mac and 3 Grade View: Grade II Tube type: Oral Tube size: 7.0 mm Number of attempts: 1 Airway Equipment and Method: Stylet and Oral airway Placement Confirmation: ETT inserted through vocal cords under direct vision, positive ETCO2 and breath sounds checked- equal and bilateral Secured at: 22 cm Tube secured with: Tape Dental Injury: Teeth and Oropharynx as per pre-operative assessment

## 2023-03-26 NOTE — Anesthesia Postprocedure Evaluation (Signed)
Anesthesia Post Note  Patient: Heidi Bender  Procedure(s) Performed: APPENDECTOMY LAPAROSCOPIC ATTEMPTED,  OPEN ILEOCECECTOMY AND APPENDECTOMY     Patient location during evaluation: PACU Anesthesia Type: General Level of consciousness: sedated and patient cooperative Pain management: pain level controlled Vital Signs Assessment: post-procedure vital signs reviewed and stable Respiratory status: spontaneous breathing Cardiovascular status: stable Anesthetic complications: no   No notable events documented.  Last Vitals:  Vitals:   03/26/23 1450 03/26/23 1517  BP:  110/72  Pulse: 67 64  Resp: (!) 27 (!) 22  Temp: 36.6 C (!) 36.4 C  SpO2: 95% 97%    Last Pain:  Vitals:   03/26/23 1517  TempSrc: Oral  PainSc:                  [name]

## 2023-03-26 NOTE — Op Note (Signed)
Date: 03/26/23  Patient: Heidi Bender MRN: [id number]  Preoperative Diagnosis: Persistent appendicitis of remnant appendix Postoperative Diagnosis: Same  Procedure: Laparoscopic converted to open ileocecectomy  Surgeon: Sophronia Simas, MD Assistant: Barnetta Chapel, PA-C  EBL: 50 mL  Anesthesia: General endotracheal  Specimens: Terminal ileum, cecum and appendix  Indications: [name] is a 42 yo female who underwent a laparoscopic appendectomy for perforated appendicitis 6 weeks ago. She had significant inflammation at the time and only part of the appendix was able to be removed. She has continued to have RLQ abdominal pain, which improves with antibiotics, but recurs with cessation of antibiotics. CT scan showed a remnant appendix with an obstructive fecalith. As she has had persistent pain, the decision was made to proceed with completion appendectomy. The patient was consented for a possible open procedure and possible ileocecectomy.  Findings: ***  Procedure details: Informed consent was obtained in the preoperative area prior to the procedure. The patient was brought to the operating room and placed on the table in the supine position. *** anesthesia was induced and appropriate lines and drains were placed for intraoperative monitoring. Perioperative antibiotics were administered per SCIP guidelines. The *** was prepped and draped in the usual sterile fashion. A pre-procedure timeout was taken verifying patient identity, surgical site and procedure to be performed.  ***  The patient tolerated the procedure well with no apparent complications. All counts were correct x2 at the end of the procedure. The patient was extubated and taken to PACU in stable condition.  Sophronia Simas, MD 03/26/23 2:01 PM

## 2023-03-26 NOTE — Transfer of Care (Signed)
Immediate Anesthesia Transfer of Care Note  Patient: Heidi Bender  Procedure(s) Performed: APPENDECTOMY LAPAROSCOPIC ATTEMPTED,  OPEN ILEOCECECTOMY AND APPENDECTOMY  Patient Location: PACU  Anesthesia Type:General  Level of Consciousness: awake, oriented, and patient cooperative  Airway & Oxygen Therapy: Patient Spontanous Breathing and Patient connected to nasal cannula oxygen  Post-op Assessment: Report given to RN and Post -op Vital signs reviewed and stable  Post vital signs: Reviewed and stable  Last Vitals:  Vitals Value Taken Time  BP 111/66 03/26/23 1422  Temp    Pulse 67 03/26/23 1423  Resp 30 03/26/23 1423  SpO2 95 % 03/26/23 1423  Vitals shown include unfiled device data.  Last Pain:  Vitals:   03/26/23 0940  TempSrc: Oral  PainSc: 0-No pain      Patients Stated Pain Goal: 0 (03/25/23 1008)  Complications: No notable events documented.

## 2023-03-26 NOTE — Anesthesia Preprocedure Evaluation (Addendum)
Anesthesia Evaluation  Patient identified by MRN, date of birth, ID band Patient awake    Reviewed: Allergy & Precautions, NPO status , Patient's Chart, lab work & pertinent test results  Airway Mallampati: III  TM Distance: >3 FB Neck ROM: Full    Dental  (+) Dental Advisory Given, Teeth Intact   Pulmonary neg pulmonary ROS   Pulmonary exam normal breath sounds clear to auscultation       Cardiovascular Normal cardiovascular exam Rhythm:Regular Rate:Normal     Neuro/Psych negative neurological ROS  negative psych ROS   GI/Hepatic negative GI ROS, Neg liver ROS,,,  Endo/Other  negative endocrine ROS    Renal/GU negative Renal ROS     Musculoskeletal negative musculoskeletal ROS (+)    Abdominal  (+) + obese  Peds  Hematology negative hematology ROS (+)   Anesthesia Other Findings   Reproductive/Obstetrics                             Anesthesia Physical Anesthesia Plan  ASA: 2  Anesthesia Plan: General   Post-op Pain Management: Tylenol PO (pre-op)* and Toradol IV (intra-op)*   Induction: Intravenous  PONV Risk Score and Plan: 4 or greater and Ondansetron, Dexamethasone, Midazolam and Scopolamine patch - Pre-op  Airway Management Planned: Oral ETT  Additional Equipment: None  Intra-op Plan:   Post-operative Plan: Extubation in OR  Informed Consent: I have reviewed the patients History and Physical, chart, labs and discussed the procedure including the risks, benefits and alternatives for the proposed anesthesia with the patient or authorized representative who has indicated his/her understanding and acceptance.     Dental advisory given  Plan Discussed with: CRNA  Anesthesia Plan Comments:        Anesthesia Quick Evaluation

## 2023-03-26 NOTE — Plan of Care (Signed)

## 2023-03-27 LAB — BASIC METABOLIC PANEL
Anion gap: 12 (ref 5–15)
BUN: 9 mg/dL (ref 6–20)
CO2: 23 mmol/L (ref 22–32)
Calcium: 8.4 mg/dL — ABNORMAL LOW (ref 8.9–10.3)
Chloride: 98 mmol/L (ref 98–111)
Creatinine, Ser: 0.63 mg/dL (ref 0.44–1.00)
GFR, Estimated: >60 mL/min (ref >60)
Glucose, Bld: 112 mg/dL — ABNORMAL HIGH (ref 70–99)
Potassium: 4.1 mmol/L (ref 3.5–5.1)
Sodium: 133 mmol/L — ABNORMAL LOW (ref 135–145)

## 2023-03-27 LAB — CBC
HCT: 36.6 % (ref 36.0–46.0)
Hemoglobin: 11.7 g/dL — ABNORMAL LOW (ref 12.0–15.0)
MCH: 27.1 pg (ref 26.0–34.0)
MCHC: 32 g/dL (ref 30.0–36.0)
MCV: 84.9 fL (ref 80.0–100.0)
Platelets: 324 10*3/uL (ref 150–400)
RBC: 4.31 MIL/uL (ref 3.87–5.11)
RDW: 14.3 % (ref 11.5–15.5)
WBC: 10.5 10*3/uL (ref 4.0–10.5)
nRBC: 0 % (ref 0.0–0.2)

## 2023-03-27 MED ORDER — SODIUM CHLORIDE 0.9 % IV SOLN: INTRAVENOUS | Status: DC

## 2023-03-27 NOTE — Plan of Care (Signed)

## 2023-03-27 NOTE — Progress Notes (Signed)
Patient ID: Heidi Bender, female   DOB: Nov 15, 1980, 42 y.o.   MRN: [id number] [facility] Surgery Progress Note  1 Day Post-Op  Subjective: CC-  Abdomen sore. Pain medication helps but wears off quickly. She has had nausea over night and emesis this morning. No flatus or BM. Ambulated to restroom.  Objective: Vital signs in last 24 hours: Temp:  [97.5 F (36.4 C)-98.8 F (37.1 C)] 97.8 F (36.6 C) (09/14 0821) Pulse Rate:  [64-75] 71 (09/14 0821) Resp:  [17-30] 17 (09/14 0821) BP: (103-117)/(66-77) 117/77 (09/14 0821) SpO2:  [92 %-97 %] 92 % (09/14 0821) Last BM Date : 03/25/23  Intake/Output from previous day: 09/13 0701 - 09/14 0700 In: 2190 [P.O.:240; I.V.:1300; IV Piggyback:650] Out: 1175 [Urine:975; Blood:200] Intake/Output this shift: No intake/output data recorded.  PE: Gen:  Alert, NAD, pleasant Abd: soft, some distension, hypoactive bowel sounds, midline wound cdi with honeycomb dressing present and a little dried blood, appropriately tender over invision  Lab Results:  Recent Labs    03/25/23 0223 03/27/23 0231  WBC 6.2 10.5  HGB 11.2* 11.7*  HCT 35.5* 36.6  PLT 312 324   BMET Recent Labs    03/25/23 0223 03/27/23 0231  NA 137 133*  K 3.6 4.1  CL 103 98  CO2 23 23  GLUCOSE 102* 112*  BUN 8 9  CREATININE 0.75 0.63  CALCIUM 8.4* 8.4*   PT/INR No results for input(s): "LABPROT", "INR" in the last 72 hours. CMP     Component Value Date/Time   NA 133 (L) 03/27/2023 0231   K 4.1 03/27/2023 0231   CL 98 03/27/2023 0231   CO2 23 03/27/2023 0231   GLUCOSE 112 (H) 03/27/2023 0231   BUN 9 03/27/2023 0231   CREATININE 0.63 03/27/2023 0231   CALCIUM 8.4 (L) 03/27/2023 0231   PROT 7.5 03/04/2023 1703   ALBUMIN 3.4 (L) 03/04/2023 1703   AST 24 03/04/2023 1703   ALT 34 03/04/2023 1703   ALKPHOS 65 03/04/2023 1703   BILITOT 0.6 03/04/2023 1703   GFRNONAA >60 03/27/2023 0231   Lipase     Component Value Date/Time   LIPASE 23 03/04/2023  1703       Studies/Results: No results found.  Anti-infectives: Anti-infectives (From admission, onward)    Start     Dose/Rate Route Frequency Ordered Stop   03/27/23 0000  metroNIDAZOLE (FLAGYL) IVPB 500 mg       Placed in "And" Linked Group   500 mg 100 mL/hr over 60 Minutes Intravenous Every 12 hours 03/26/23 1517 03/28/23 2359   03/26/23 2000  cefTRIAXone (ROCEPHIN) 2 g in sodium chloride 0.9 % 100 mL IVPB       Placed in "And" Linked Group   2 g 200 mL/hr over 30 Minutes Intravenous Every 24 hours 03/26/23 1517 03/28/23 1959   03/24/23 2000  cefTRIAXone (ROCEPHIN) 2 g in sodium chloride 0.9 % 100 mL IVPB  Status:  Discontinued       Placed in "And" Linked Group   2 g 200 mL/hr over 30 Minutes Intravenous Every 24 hours 03/24/23 1903 03/26/23 1517   03/24/23 2000  metroNIDAZOLE (FLAGYL) IVPB 500 mg  Status:  Discontinued       Placed in "And" Linked Group   500 mg 100 mL/hr over 60 Minutes Intravenous Every 12 hours 03/24/23 1903 03/26/23 1517        Assessment/Plan Persistent appendicitis of remnant appendix  -POD#1 s/p Laparoscopic converted to open ileocecectomy 9/13 Dr. Freida Bender -  surgical path pending - Back diet down to sips/chips. If she vomits again she may need an NG tube but will hold off on this for now - mobilize -multimodal pain control  ID - rocephin/flagyl >>9/15 FEN - IVF, NPO VTE - lovenox Foley - out and voiding    LOS: 2 days    Franne Forts, [facility] Surgery 03/27/2023, 10:39 AM Please see Amion for pager number during day hours 7:00am-4:30pm

## 2023-03-28 LAB — CBC
HCT: 34.5 % — ABNORMAL LOW (ref 36.0–46.0)
Hemoglobin: 11 g/dL — ABNORMAL LOW (ref 12.0–15.0)
MCH: 28 pg (ref 26.0–34.0)
MCHC: 31.9 g/dL (ref 30.0–36.0)
MCV: 87.8 fL (ref 80.0–100.0)
Platelets: 304 10*3/uL (ref 150–400)
RBC: 3.93 MIL/uL (ref 3.87–5.11)
RDW: 15.1 % (ref 11.5–15.5)
WBC: 10.6 10*3/uL — ABNORMAL HIGH (ref 4.0–10.5)
nRBC: 0 % (ref 0.0–0.2)

## 2023-03-28 LAB — BASIC METABOLIC PANEL
Anion gap: 14 (ref 5–15)
BUN: 5 mg/dL — ABNORMAL LOW (ref 6–20)
CO2: 21 mmol/L — ABNORMAL LOW (ref 22–32)
Calcium: 8.2 mg/dL — ABNORMAL LOW (ref 8.9–10.3)
Chloride: 103 mmol/L (ref 98–111)
Creatinine, Ser: 0.66 mg/dL (ref 0.44–1.00)
GFR, Estimated: >60 mL/min (ref >60)
Glucose, Bld: 108 mg/dL — ABNORMAL HIGH (ref 70–99)
Potassium: 3.6 mmol/L (ref 3.5–5.1)
Sodium: 138 mmol/L (ref 135–145)

## 2023-03-28 NOTE — Progress Notes (Signed)
Patient ID: Heidi Bender, female   DOB: April 06, 1981, 42 y.o.   MRN: [id number] [facility] Surgery Progress Note  2 Days Post-Op  Subjective: CC-  Asking for something to eat. States that her abdominal pain is a little better. No more n/v since yesterday morning. She is burping. No flatus or BM. Ambulated in the hall yesterday.  Objective: Vital signs in last 24 hours: Temp:  [98 F (36.7 C)-98.7 F (37.1 C)] 98.2 F (36.8 C) (09/15 0754) Pulse Rate:  [73-83] 73 (09/15 0754) Resp:  [17-19] 18 (09/15 0754) BP: (105-114)/(67-76) 114/76 (09/15 0754) SpO2:  [96 %-98 %] 96 % (09/15 0754) Last BM Date : 03/25/23  Intake/Output from previous day: 09/14 0701 - 09/15 0700 In: -  Out: 200 [Urine:200] Intake/Output this shift: No intake/output data recorded.  PE: Gen:  Alert, NAD, pleasant Abd: soft, some distension, hypoactive bowel sounds, midline wound cdi with honeycomb dressing present and a little dried blood, appropriately tender over invision  Lab Results:  Recent Labs    03/27/23 0231 03/28/23 0227  WBC 10.5 10.6*  HGB 11.7* 11.0*  HCT 36.6 34.5*  PLT 324 304   BMET Recent Labs    03/27/23 0231 03/28/23 0227  NA 133* 138  K 4.1 3.6  CL 98 103  CO2 23 21*  GLUCOSE 112* 108*  BUN 9 5*  CREATININE 0.63 0.66  CALCIUM 8.4* 8.2*   PT/INR No results for input(s): "LABPROT", "INR" in the last 72 hours. CMP     Component Value Date/Time   NA 138 03/28/2023 0227   K 3.6 03/28/2023 0227   CL 103 03/28/2023 0227   CO2 21 (L) 03/28/2023 0227   GLUCOSE 108 (H) 03/28/2023 0227   BUN 5 (L) 03/28/2023 0227   CREATININE 0.66 03/28/2023 0227   CALCIUM 8.2 (L) 03/28/2023 0227   PROT 7.5 03/04/2023 1703   ALBUMIN 3.4 (L) 03/04/2023 1703   AST 24 03/04/2023 1703   ALT 34 03/04/2023 1703   ALKPHOS 65 03/04/2023 1703   BILITOT 0.6 03/04/2023 1703   GFRNONAA >60 03/28/2023 0227   Lipase     Component Value Date/Time   LIPASE 23 03/04/2023 1703        Studies/Results: No results found.  Anti-infectives: Anti-infectives (From admission, onward)    Start     Dose/Rate Route Frequency Ordered Stop   03/27/23 0000  metroNIDAZOLE (FLAGYL) IVPB 500 mg       Placed in "And" Linked Group   500 mg 100 mL/hr over 60 Minutes Intravenous Every 12 hours 03/26/23 1517 03/28/23 2359   03/26/23 2000  cefTRIAXone (ROCEPHIN) 2 g in sodium chloride 0.9 % 100 mL IVPB       Placed in "And" Linked Group   2 g 200 mL/hr over 30 Minutes Intravenous Every 24 hours 03/26/23 1517 03/27/23 2136   03/24/23 2000  cefTRIAXone (ROCEPHIN) 2 g in sodium chloride 0.9 % 100 mL IVPB  Status:  Discontinued       Placed in "And" Linked Group   2 g 200 mL/hr over 30 Minutes Intravenous Every 24 hours 03/24/23 1903 03/26/23 1517   03/24/23 2000  metroNIDAZOLE (FLAGYL) IVPB 500 mg  Status:  Discontinued       Placed in "And" Linked Group   500 mg 100 mL/hr over 60 Minutes Intravenous Every 12 hours 03/24/23 1903 03/26/23 1517        Assessment/Plan Persistent appendicitis of remnant appendix  -POD#2 s/p Laparoscopic converted to open ileocecectomy 9/13  Dr. Freida Busman - surgical path pending - Continue sips/chips from the floor and await return in bowel function - mobilize - multimodal pain control   ID - rocephin/flagyl >>9/15 FEN - IVF, sips/chips VTE - lovenox Foley - out and voiding    LOS: 3 days    Franne Forts, [facility] Surgery 03/28/2023, 9:36 AM Please see Amion for pager number during day hours 7:00am-4:30pm

## 2023-03-29 ENCOUNTER — Encounter (HOSPITAL_COMMUNITY): Payer: Self-pay | Admitting: Surgery

## 2023-03-29 LAB — CBC
HCT: 31.9 % — ABNORMAL LOW (ref 36.0–46.0)
Hemoglobin: 10.1 g/dL — ABNORMAL LOW (ref 12.0–15.0)
MCH: 27.9 pg (ref 26.0–34.0)
MCHC: 31.7 g/dL (ref 30.0–36.0)
MCV: 88.1 fL (ref 80.0–100.0)
Platelets: 289 10*3/uL (ref 150–400)
RBC: 3.62 MIL/uL — ABNORMAL LOW (ref 3.87–5.11)
RDW: 15.2 % (ref 11.5–15.5)
WBC: 10.3 10*3/uL (ref 4.0–10.5)
nRBC: 0 % (ref 0.0–0.2)

## 2023-03-29 LAB — BASIC METABOLIC PANEL
Anion gap: 9 (ref 5–15)
BUN: 6 mg/dL (ref 6–20)
CO2: 22 mmol/L (ref 22–32)
Calcium: 8.2 mg/dL — ABNORMAL LOW (ref 8.9–10.3)
Chloride: 106 mmol/L (ref 98–111)
Creatinine, Ser: 0.67 mg/dL (ref 0.44–1.00)
GFR, Estimated: >60 mL/min (ref >60)
Glucose, Bld: 90 mg/dL (ref 70–99)
Potassium: 3.4 mmol/L — ABNORMAL LOW (ref 3.5–5.1)
Sodium: 137 mmol/L (ref 135–145)

## 2023-03-29 LAB — MAGNESIUM: Magnesium: 2.2 mg/dL (ref 1.7–2.4)

## 2023-03-29 LAB — SURGICAL PATHOLOGY

## 2023-03-29 MED ORDER — PHENOL 1.4 % MT LIQD
1.0000 | OROMUCOSAL | Status: DC | PRN
  Filled 2023-03-29: qty 177

## 2023-03-29 NOTE — Progress Notes (Addendum)
Patient ID: Heidi Bender, female   DOB: 1981/01/08, 42 y.o.   MRN: [id number] [facility] Surgery Progress Note  3 Days Post-Op  Subjective: Feels better today, denies nausea and vomiting. Had a bowel movement this morning, passing flatus.  The history was obtained with the assistance of an in-person Spanish interpreter.  Objective: Vital signs in last 24 hours: Temp:  [98.2 F (36.8 C)-98.8 F (37.1 C)] 98.2 F (36.8 C) (09/16 0711) Pulse Rate:  [74-82] 80 (09/16 0711) Resp:  [16-18] 16 (09/16 0711) BP: (110-123)/(61-80) 110/61 (09/16 0711) SpO2:  [97 %-99 %] 97 % (09/16 0711) Last BM Date : 03/25/23  Intake/Output from previous day: 09/15 0701 - 09/16 0700 In: 3197.2 [I.V.:2897.2; IV Piggyback:300] Out: -  Intake/Output this shift: No intake/output data recorded.  PE: Gen:  Alert, NAD, pleasant Neuro: alert and oriented, no focal deficits Resp: normal work of breathing on room air Abd: soft, nondistended, nontender. Midline wound cdi with honeycomb dressing present and a little dried blood.  Lab Results:  Recent Labs    03/28/23 0227 03/29/23 0220  WBC 10.6* 10.3  HGB 11.0* 10.1*  HCT 34.5* 31.9*  PLT 304 289   BMET Recent Labs    03/28/23 0227 03/29/23 0220  NA 138 137  K 3.6 3.4*  CL 103 106  CO2 21* 22  GLUCOSE 108* 90  BUN 5* 6  CREATININE 0.66 0.67  CALCIUM 8.2* 8.2*   PT/INR No results for input(s): "LABPROT", "INR" in the last 72 hours. CMP     Component Value Date/Time   NA 137 03/29/2023 0220   K 3.4 (L) 03/29/2023 0220   CL 106 03/29/2023 0220   CO2 22 03/29/2023 0220   GLUCOSE 90 03/29/2023 0220   BUN 6 03/29/2023 0220   CREATININE 0.67 03/29/2023 0220   CALCIUM 8.2 (L) 03/29/2023 0220   PROT 7.5 03/04/2023 1703   ALBUMIN 3.4 (L) 03/04/2023 1703   AST 24 03/04/2023 1703   ALT 34 03/04/2023 1703   ALKPHOS 65 03/04/2023 1703   BILITOT 0.6 03/04/2023 1703   GFRNONAA >60 03/29/2023 0220   Lipase     Component Value  Date/Time   LIPASE 23 03/04/2023 1703     Assessment/Plan Persistent appendicitis of remnant appendix  -POD#3 s/p Laparoscopic converted to open ileocecectomy 9/13 Dr. Freida Busman - surgical path pending - Having bowel function - Advance to clear liquid diet. Soft diet later today if tolerating clear liquids. - mobilize - multimodal pain control   ID - Abx completed, no need for further abx FEN - CLD VTE - lovenox Anticipate discharge in next 1-2 days.    LOS: 4 days    Fritzi Mandes, MD [facility] Surgery 03/29/2023, 8:08 AM Please see Amion for pager number during day hours 7:00am-4:30pm

## 2023-03-29 NOTE — Progress Notes (Signed)
I assisted Fritzi Mandes, MD with interpretation and plan of care, by Orlan Leavens Spanish Medical Interpreter.

## 2023-03-29 NOTE — Plan of Care (Signed)
  Problem: Pain Managment: Goal: General experience of comfort will improve Outcome: Progressing   Problem: Safety: Goal: Ability to remain free from injury will improve Outcome: Progressing   Problem: Skin Integrity: Goal: Risk for impaired skin integrity will decrease Outcome: Progressing   

## 2023-03-30 MED ORDER — METHOCARBAMOL 500 MG PO TABS: 500.0000 mg | ORAL_TABLET | Freq: Three times a day (TID) | ORAL | 0 refills | Status: DC | PRN

## 2023-03-30 MED ORDER — ACETAMINOPHEN 500 MG PO TABS: 1000.0000 mg | ORAL_TABLET | Freq: Three times a day (TID) | ORAL | Status: AC | PRN

## 2023-03-30 MED ORDER — OXYCODONE HCL 5 MG PO TABS: 5.0000 mg | ORAL_TABLET | ORAL | 0 refills | Status: AC | PRN

## 2023-03-30 MED ORDER — IBUPROFEN 400 MG PO TABS: 400.0000 mg | ORAL_TABLET | Freq: Three times a day (TID) | ORAL | Status: AC | PRN

## 2023-03-30 NOTE — Plan of Care (Signed)
  Problem: Pain Managment: Goal: General experience of comfort will improve Outcome: Progressing   Problem: Safety: Goal: Ability to remain free from injury will improve Outcome: Progressing   

## 2023-03-30 NOTE — Progress Notes (Signed)
Patient ID: Heidi Bender, female   DOB: Mar 31, 1981, 42 y.o.   MRN: [id number] [facility] Surgery Progress Note  4 Days Post-Op  Subjective: Feels good today, pain controlled. Having multiple bowel movements which are loose. Afebrile, vitals stable. Feels hungry.  The history was obtained with the assistance of an in-person Spanish interpreter Orlan Leavens).  Objective: Vital signs in last 24 hours: Temp:  [97.7 F (36.5 C)-98.7 F (37.1 C)] 97.7 F (36.5 C) (09/17 0525) Pulse Rate:  [68-87] 68 (09/17 0525) Resp:  [16-19] 19 (09/17 0525) BP: (114-117)/(71-80) 114/71 (09/17 0525) SpO2:  [98 %-99 %] 98 % (09/17 0525) Last BM Date : 03/29/23  Intake/Output from previous day: 09/16 0701 - 09/17 0700 In: 120 [P.O.:120] Out: -  Intake/Output this shift: No intake/output data recorded.  PE: Gen:  Alert, NAD, pleasant Neuro: alert and oriented, no focal deficits Resp: normal work of breathing on room air Abd: soft, nondistended, nontender. Midline wound clean and dry with steristrips, port sites clean and dry.  Lab Results:  Recent Labs    03/28/23 0227 03/29/23 0220  WBC 10.6* 10.3  HGB 11.0* 10.1*  HCT 34.5* 31.9*  PLT 304 289   BMET Recent Labs    03/28/23 0227 03/29/23 0220  NA 138 137  K 3.6 3.4*  CL 103 106  CO2 21* 22  GLUCOSE 108* 90  BUN 5* 6  CREATININE 0.66 0.67  CALCIUM 8.2* 8.2*   PT/INR No results for input(s): "LABPROT", "INR" in the last 72 hours. CMP     Component Value Date/Time   NA 137 03/29/2023 0220   K 3.4 (L) 03/29/2023 0220   CL 106 03/29/2023 0220   CO2 22 03/29/2023 0220   GLUCOSE 90 03/29/2023 0220   BUN 6 03/29/2023 0220   CREATININE 0.67 03/29/2023 0220   CALCIUM 8.2 (L) 03/29/2023 0220   PROT 7.5 03/04/2023 1703   ALBUMIN 3.4 (L) 03/04/2023 1703   AST 24 03/04/2023 1703   ALT 34 03/04/2023 1703   ALKPHOS 65 03/04/2023 1703   BILITOT 0.6 03/04/2023 1703   GFRNONAA >60 03/29/2023 0220   Lipase      Component Value Date/Time   LIPASE 23 03/04/2023 1703     Assessment/Plan Persistent appendicitis of remnant appendix  -POD#4 s/p Laparoscopic converted to open ileocecectomy 9/13 Dr. Freida Busman - Surgical path was benign, which I reviewed with the patient today. - Having bowel function, counseled that loose stools are common after ileocecectomy and will improve with time. - Advance to low fiber diet. - mobilize - multimodal pain control - VTE: lovenox, SCDs - Discharge home this afternoon if tolerating soft diet. Discharge instructions were reviewed with the patient.    LOS: 5 days    Fritzi Mandes, MD [facility] Surgery 03/30/2023, 8:22 AM Please see Amion for pager number during day hours 7:00am-4:30pm

## 2023-03-30 NOTE — Discharge Instructions (Addendum)
CIRUGIA: INSTRUCCIONES DE POST OPERATORIO.  Revise siempre los documentos que le entreguen en el lugar donde se ha hecho la [place].  SI USTED NECESITA DOCUMENTOS DE INCAPACIDAD (DISABLE) O DE PERMISO FAMILAR (FAMILY LEAVE) NECESITA TRAERLOS A LA OFICINA PARA QUE SEAN PROCESADOS. NO  SE LOS DE A SU DOCTOR. A su alta del hospital se le dara una receta para Human resources officer. Tomela como ha sido recetada, si la necesita. Si no la necesita puede tomar, Acetaminofen (Tylenol) o Ibuprofen (Advil) para aliviar dolor moderado. Continue tomando el resto de sus medicinas. Si necesita rellenar la receta, llame a la farmacia. ellos contactan a nuestra oficina pidiendo autorizacion. Este tipo de receta no pueden ser [employer] de las  5pm o [employer] fines de [place]. Con relacion a la dieta: debe ser [employer] primeros dias despues que llege a la casa. Ejemplo: sopas y galleticas. Tome bastante liquido esos dias. La [employer] de los pacientes padecen de inflamacion y cambio de coloracion de la piel alrededor de las incisiones. esto toma dias en resolver.  pnerse una bolsa de hielo en el area affectada ayuda..  Es comun tambien tener un poco de estrenimiento si esta tomado medicinas para Chief Technology Officer. incremente la cantidad de liquidos a tomar y Engineer, production (Colace) esto previene el problema. Si ya tiene estrenimiento, es Designer, jewellery no ha defecado en 48 horas, puede tomar un laxativo (Milk of Magnesia or Miralax) uselo como el paquete le explica.  A menos que se le diga algo diferente. Remueva el bendaje a las 24-48 horas despues dela [place]. y puede banarse en la ducha sin ningun problema. usted puede tener steri-strips (pequenas curitas transparentes en la piel puesta encima de la incision)  Estas banditas strips should be left on the skin for 7-10 days.   Si su cirujano puso pegamento encima de la incision usted puede banarse bajo la ducha en 24 horas. Este pegamento empezara a caerse en las proximas 2-3  semanas. Si le pusieron suturas o presillas (grapos) estos seran quitados en su proxima cita en la oficina. . ACTIVIDADES:  Puede hacer actividad ligera.  Como caminar , subir escaleras y poco a poco irlas incrementando tanto como las [place]. Puede tener relaciones sexuales cuando sea comfortable. No carge objetos pesados o haga esfuerzos que no sean aprovados por su doctor. Puede manejar en cuanto no esta tomando medicamentos fuertes (narcoticos) para Chief Technology Officer, pueda abrochar confortablemente el cinturon de seguridad, y pueda Personnel officer y usar los pedales de su vehiculo con seguridad. PUEDE REGRESAR A TRABAJAR  Tiene una cita con Dr. Freida Busman 8 de [place] a 10:20am. La direccion: [facility], [address], [place], [place] [address]. OTRAS ISNSTRUCCIONES:___________________________________________________________________________________ Debbora Lacrosse A SU MEDICO: FIEBRE mayor de  101.0 No produccion de [place]. Sangramiento continue de la herida Incremento de Engineer, mining, enrojecimientio o drenaje de la herida (incision) Incremento de dolor abdominal.  The clinic staff is available to answer your questions during regular business hours.  Please don't hesitate to call and ask to speak to one of the nurses for clinical concerns.  If you have a medical emergency, go to the nearest emergency room or call 911.  A surgeon from [facility] is always on call at the hospital. [address], [place], [place]  [address] ? [address] [address], [place], [place]   [address] [phone] ? [phone] ? FAX [phone] Web site: [email or web address]

## 2023-03-30 NOTE — Progress Notes (Signed)
Pt discharged and AVS reviewed. With husband and exiting in wheelchair.

## 2023-03-31 NOTE — Discharge Summary (Signed)
/  Patient ID: Heidi Bender [id number] 07-03-1981 42 y.o.  Admit date: 03/24/2023 Discharge date: 03/30/2023  Admitting Diagnosis: Stump appendicitis  Discharge Diagnosis Patient Active Problem List   Diagnosis Date Noted   Postoperative pain 03/24/2023   Acute perforated appendicitis 02/11/2023    Consultants none  Reason for Admission: 42 y/o F w/ a left-sided ovarian cyst who underwent a laparoscopic appendectomy on 03/17/23 and now presents with abdominal pain and has a CT showing persistent inflammation in the RLQ. She initially presented to an OSH, where imaging showed the known ovarian cyst without concern for torsion as well as stranding in the RLQ. She was transferred to [facility] and arrived in stable condition. WBC 10. She is AF and HDS.   Procedures: Dr. Freida Busman, 03/26/23  Laparoscopic converted to open appendectomy with ileocecectomy   Hospital Course:  The patient was admitted and placed on abx therapy.  Her imaging was reviewed and found to have a significant amount of appendix remaining with an appendicolith at the base.  She was taken to the OR for the above procedure.  She tolerated this well.  Her diet was able to be advanced as tolerated post op.  She was stable on POD 4 for DC home.   Allergies as of 03/30/2023   No Known Allergies      Medication List     STOP taking these medications    amoxicillin-clavulanate 875-125 MG tablet Commonly known as: AUGMENTIN   oxyCODONE-acetaminophen 5-325 MG tablet Commonly known as: PERCOCET/ROXICET       TAKE these medications    acetaminophen 500 MG tablet Commonly known as: TYLENOL Take 2 tablets (1,000 mg total) by mouth every 8 (eight) hours as needed for mild pain.   Aranelle 0.5/1/0.5-35 MG-MCG tablet Generic drug: Norethindrone-Ethinyl Estradiol Triphasic Take 1 tablet by mouth daily.   Hydrocortisone (Perianal) 1 % Crea Apply 1 application  topically 3 (three) times daily.   ibuprofen  400 MG tablet Commonly known as: ADVIL Take 1 tablet (400 mg total) by mouth every 8 (eight) hours as needed for mild pain.   methocarbamol 500 MG tablet Commonly known as: ROBAXIN Take 1 tablet (500 mg total) by mouth every 8 (eight) hours as needed for muscle spasms.   oxyCODONE 5 MG immediate release tablet Commonly known as: Oxy IR/ROXICODONE Take 1 tablet (5 mg total) by mouth every 4 (four) hours as needed for up to 5 days for severe pain.          Follow-up Information     Fritzi Mandes, MD Follow up on 04/20/2023.   Specialty: General Surgery Why: 10:20am, Arrive 30 minutes prior to your appointment time, Please bring your insurance card and photo ID Contact information: [address] [place] [address] [place] [address] [phone]                 Signed: Barnetta Bender, [facility] Surgery 03/31/2023, 7:43 AM Please see Amion for pager number during day hours 7:00am-4:30pm, 7-11:30am on Weekends

## 2023-07-13 ENCOUNTER — Encounter: Payer: Self-pay | Admitting: Gastroenterology

## 2023-07-13 ENCOUNTER — Ambulatory Visit (INDEPENDENT_AMBULATORY_CARE_PROVIDER_SITE_OTHER): Payer: BLUE CROSS/BLUE SHIELD | Admitting: Gastroenterology

## 2023-07-13 VITALS — BP 94/70 | HR 80 | Ht 70.0 in | Wt 215.1 lb

## 2023-07-13 DIAGNOSIS — R194 Change in bowel habit: Secondary | ICD-10-CM

## 2023-07-13 DIAGNOSIS — R197 Diarrhea, unspecified: Secondary | ICD-10-CM

## 2023-07-13 DIAGNOSIS — K625 Hemorrhage of anus and rectum: Secondary | ICD-10-CM | POA: Insufficient documentation

## 2023-07-13 MED ORDER — NA SULFATE-K SULFATE-MG SULF 17.5-3.13-1.6 GM/177ML PO SOLN: 1.0000 | Freq: Once | ORAL | 0 refills | Status: AC

## 2023-07-13 NOTE — Progress Notes (Signed)
 07/13/2023 Heidi Bender [id number] 04/20/1981   HISTORY OF PRESENT ILLNESS: This is a 42 year old female who is new to our office.  Actually referred here by surgeon, Dr. Dasie, for evaluation of rectal bleeding.  Patient had an appendectomy earlier this year and then had to have repeat surgery with an ileocecectomy due to some ongoing infection.  Says she is on a lot of antibiotics as well.  She says that since that surgery she has had some issues with diarrhea/loose stool.  She has gotten somewhat better, is having some normal stools now as well.  Says that after she eats anything she has to go to the bathroom, but sometimes it is normal, sometimes diarrhea.  Now for the past 3 weeks or so she has been having rectal bleeding, bright red rectal bleeding on the toilet paper and in the toilet.  2013 cholecystectomy  Patient is primarily Spanish-speaking so an interpreter was used during the entirety of the visit.   Past Medical History:  Diagnosis Date   Anemia    Ovarian cyst    Vitamin D deficiency    Past Surgical History:  Procedure Laterality Date   CHOLECYSTECTOMY     DILATION AND CURETTAGE OF UTERUS     LAPAROSCOPIC APPENDECTOMY N/A 02/11/2023   Procedure: APPENDECTOMY LAPAROSCOPIC;  Surgeon: Sebastian Moles, MD;  Location: [facility] OR;  Service: General;  Laterality: N/A;   LAPAROSCOPIC APPENDECTOMY N/A 03/26/2023   Procedure: APPENDECTOMY LAPAROSCOPIC ATTEMPTED,  OPEN ILEOCECECTOMY AND APPENDECTOMY;  Surgeon: Dasie Leonor CROME, MD;  Location: [facility] OR;  Service: General;  Laterality: N/A;    reports that she has never smoked. She has never used smokeless tobacco. She reports that she does not drink alcohol and does not use drugs. family history includes Diabetes in her father, maternal grandfather, maternal grandmother, mother, and sister. No Known Allergies    Outpatient Encounter Medications as of 07/13/2023  Medication Sig   acetaminophen  (TYLENOL ) 500 MG tablet Take 2  tablets (1,000 mg total) by mouth every 8 (eight) hours as needed for mild pain.   ibuprofen  (ADVIL ) 400 MG tablet Take 1 tablet (400 mg total) by mouth every 8 (eight) hours as needed for mild pain.   Vitamin D, Ergocalciferol, (DRISDOL) 1.25 MG (50000 UNIT) CAPS capsule Take 50,000 Units by mouth every 7 (seven) days.   [DISCONTINUED] ARANELLE 0.5/1/0.5-35 MG-MCG tablet Take 1 tablet by mouth daily. (Patient not taking: Reported on 03/25/2023)   [DISCONTINUED] Hydrocortisone, Perianal, 1 % CREA Apply 1 application  topically 3 (three) times daily.   [DISCONTINUED] methocarbamol  (ROBAXIN ) 500 MG tablet Take 1 tablet (500 mg total) by mouth every 8 (eight) hours as needed for muscle spasms.   No facility-administered encounter medications on file as of 07/13/2023.     REVIEW OF SYSTEMS  : All other systems reviewed and negative except where noted in the History of Present Illness.   PHYSICAL EXAM: BP 94/70 (BP Location: Left Arm, Patient Position: Sitting, Cuff Size: Large)   Pulse 80   Ht 5' 10 (1.778 m)   Wt 215 lb 2 oz (97.6 kg)   LMP 07/29/2022   BMI 30.87 kg/m  General: Well developed female in no acute distress Head: Normocephalic and atraumatic Eyes:  Sclerae anicteric, conjunctiva pink. Ears: Normal auditory acuity Lungs: Clear throughout to auscultation; no W/R/R. Heart: Regular rate and rhythm; no M/R/G. Abdomen: Soft, non-distended.  BS present.  Non-tender. Rectal:  Will be done at the time of colonoscopy. Musculoskeletal: Symmetrical with no  gross deformities  Skin: No lesions on visible extremities Extremities: No edema  Neurological: Alert oriented x 4, grossly non-focal Psychological:  Alert and cooperative. Normal mood and affect  ASSESSMENT AND PLAN: *42 year old female with complaints of change in bowel habits, diarrhea since having an ileocecectomy/appendectomy earlier this year and now rectal bleeding for the past few weeks.  She does not have a gallbladder,  had that removed in 2013, but did not historically have issues with diarrhea until her ileocecectomy/appendectomy.  Reports that she was on a lot of antibiotics as well.  The diarrhea is intermittent and after eating, has improved some and suspect that it could have been due to disrupt of her gut flora from the antibiotics and possibly her body just adjusting after surgery.  Bleeding possibly hemorrhoids.  Never had a colonoscopy in the past, we will schedule for colonoscopy with Dr. San.  The risks, benefits, and alternatives to colonoscopy were discussed with the patient and she consents to proceed.   CC:  Dasie Leonor CROME, MD

## 2023-07-13 NOTE — Patient Instructions (Addendum)
 Le han programado una colonoscopia. Siga las instrucciones escritas que se le dieron en su visita de hoy.   Recoja sus suministros de preparacin en la farmacia dentro de los prximos 1 a 3 das.  Si usa  inhaladores (incluso solo cuando sea necesario), trigalos con usted el da de su procedimiento.  NO TOME 7 DAS ANTES DE LA PRUEBA. Trulicidad (dulaglutida) Ozempic, Wegovy (semaglutida) Mounjaro (tirzepatida) Bydureon Bcise (exanatida de liberacin prolongada)  NO TOME 1 DA ANTES DE SU PRUEBA Rybelsus (semaglutida) Adlyxin (lixisenatida) Victoza (liraglutida) Byetta (exanatida)  _____________________________________________________________   You have been scheduled for a colonoscopy. Please follow written instructions given to you at your visit today.   Please pick up your prep supplies at the pharmacy within the next 1-3 days.  If you use inhalers (even only as needed), please bring them with you on the day of your procedure.  DO NOT TAKE 7 DAYS PRIOR TO TEST- Trulicity (dulaglutide) Ozempic, Wegovy (semaglutide) Mounjaro (tirzepatide) Bydureon Bcise (exanatide extended release)  DO NOT TAKE 1 DAY PRIOR TO YOUR TEST Rybelsus (semaglutide) Adlyxin (lixisenatide) Victoza (liraglutide) Byetta (exanatide)  _______________________________________________________  If your blood pressure at your visit was 140/90 or greater, please contact your primary care physician to follow up on this.  _______________________________________________________  If you are age 61 or older, your body mass index should be between 23-30. Your Body mass index is 30.87 kg/m. If this is out of the aforementioned range listed, please consider follow up with your Primary Care Provider.  If you are age 40 or younger, your body mass index should be between 19-25. Your Body mass index is 30.87 kg/m. If this is out of the aformentioned range listed, please consider follow up with your Primary Care  Provider.   ________________________________________________________  The [facility] providers would like to encourage you to use MYCHART to communicate with providers for non-urgent requests or questions.  Due to long hold times on the telephone, sending your provider a message by [facility] may be a faster and more efficient way to get a response.  Please allow 48 business hours for a response.  Please remember that this is for non-urgent requests.  _______________________________________________________

## 2023-07-20 ENCOUNTER — Encounter: Payer: Self-pay | Admitting: Gastroenterology

## 2023-07-20 ENCOUNTER — Ambulatory Visit (AMBULATORY_SURGERY_CENTER): Payer: Medicaid Other | Admitting: Gastroenterology

## 2023-07-20 VITALS — BP 120/80 | HR 66 | Temp 98.0°F | Resp 15 | Ht 70.0 in | Wt 215.0 lb

## 2023-07-20 DIAGNOSIS — D125 Benign neoplasm of sigmoid colon: Secondary | ICD-10-CM | POA: Diagnosis not present

## 2023-07-20 DIAGNOSIS — D123 Benign neoplasm of transverse colon: Secondary | ICD-10-CM | POA: Diagnosis not present

## 2023-07-20 DIAGNOSIS — K648 Other hemorrhoids: Secondary | ICD-10-CM

## 2023-07-20 DIAGNOSIS — Z980 Intestinal bypass and anastomosis status: Secondary | ICD-10-CM | POA: Diagnosis not present

## 2023-07-20 DIAGNOSIS — K640 First degree hemorrhoids: Secondary | ICD-10-CM

## 2023-07-20 DIAGNOSIS — R194 Change in bowel habit: Secondary | ICD-10-CM

## 2023-07-20 DIAGNOSIS — K625 Hemorrhage of anus and rectum: Secondary | ICD-10-CM

## 2023-07-20 DIAGNOSIS — R195 Other fecal abnormalities: Secondary | ICD-10-CM

## 2023-07-20 DIAGNOSIS — R197 Diarrhea, unspecified: Secondary | ICD-10-CM

## 2023-07-20 MED ORDER — SODIUM CHLORIDE 0.9 % IV SOLN: 500.0000 mL | INTRAVENOUS | Status: DC

## 2023-07-20 NOTE — Progress Notes (Signed)
 Patient states there have been no changes to medical or surgical history since time of pre-visit.

## 2023-07-20 NOTE — Progress Notes (Signed)
 GASTROENTEROLOGY PROCEDURE H&P NOTE   Primary Care Physician: System, Provider Not In    Reason for Procedure:   Hematochezia, diarrhea, change in bowel habits  Plan:    Colonoscopy  Patient is appropriate for endoscopic procedure(s) in the ambulatory ([facility]) setting.  The nature of the procedure, as well as the risks, benefits, and alternatives were carefully and thoroughly reviewed with the patient. Ample time for discussion and questions allowed. The patient understood, was satisfied, and agreed to proceed.     HPI: Heidi Bender is a 43 y.o. female who presents for Colonoscopy for evaluation of hematochezia, diarrhea, change in stools .  Patient was most recently seen in the Gastroenterology Clinic on 07/13/2023.  No interval change in medical history since that appointment. Please refer to that note for full details regarding GI history and clinical presentation.   Past Medical History:  Diagnosis Date   Anemia    Ovarian cyst    Vitamin D deficiency     Past Surgical History:  Procedure Laterality Date   CHOLECYSTECTOMY     DILATION AND CURETTAGE OF UTERUS     LAPAROSCOPIC APPENDECTOMY N/A 02/11/2023   Procedure: APPENDECTOMY LAPAROSCOPIC;  Surgeon: Sebastian Moles, MD;  Location: [facility] OR;  Service: General;  Laterality: N/A;   LAPAROSCOPIC APPENDECTOMY N/A 03/26/2023   Procedure: APPENDECTOMY LAPAROSCOPIC ATTEMPTED,  OPEN ILEOCECECTOMY AND APPENDECTOMY;  Surgeon: Dasie Leonor CROME, MD;  Location: [facility] OR;  Service: General;  Laterality: N/A;    Prior to Admission medications   Medication Sig Start Date End Date Taking? Authorizing Provider  cetirizine (ZYRTEC) 10 MG tablet  07/16/23  Yes [provider]  fluticasone  (FLONASE ) 50 MCG/ACT nasal spray  07/16/23  Yes [provider]  Vitamin D, Ergocalciferol, (DRISDOL) 1.25 MG (50000 UNIT) CAPS capsule Take 50,000 Units by mouth every 7 (seven) days.   Yes [provider]  acetaminophen  (TYLENOL )  500 MG tablet Take 2 tablets (1,000 mg total) by mouth every 8 (eight) hours as needed for mild pain. 03/30/23   Dasie Leonor CROME, MD  ibuprofen  (ADVIL ) 400 MG tablet Take 1 tablet (400 mg total) by mouth every 8 (eight) hours as needed for mild pain. 03/30/23   Dasie Leonor CROME, MD    Current Outpatient Medications  Medication Sig Dispense Refill   cetirizine (ZYRTEC) 10 MG tablet      fluticasone  (FLONASE ) 50 MCG/ACT nasal spray      Vitamin D, Ergocalciferol, (DRISDOL) 1.25 MG (50000 UNIT) CAPS capsule Take 50,000 Units by mouth every 7 (seven) days.     acetaminophen  (TYLENOL ) 500 MG tablet Take 2 tablets (1,000 mg total) by mouth every 8 (eight) hours as needed for mild pain.     ibuprofen  (ADVIL ) 400 MG tablet Take 1 tablet (400 mg total) by mouth every 8 (eight) hours as needed for mild pain.     Current Facility-Administered Medications  Medication Dose Route Frequency Provider Last Rate Last Admin   0.9 %  sodium chloride  infusion  500 mL Intravenous Continuous Katleen Carraway V, DO        Allergies as of 07/20/2023   (No Known Allergies)    Family History  Problem Relation Age of Onset   Diabetes Mother    Diabetes Father    Diabetes Sister    Diabetes Maternal Grandmother    Diabetes Maternal Grandfather    Colon cancer Neg Hx    Esophageal cancer Neg Hx    Colon polyps Neg Hx    Rectal  cancer Neg Hx    Stomach cancer Neg Hx     Social History   Socioeconomic History   Marital status: Single    Spouse name: Not on file   Number of children: 0   Years of education: Not on file   Highest education level: Not on file  Occupational History   Not on file  Tobacco Use   Smoking status: Never   Smokeless tobacco: Never  Vaping Use   Vaping status: Never Used  Substance and Sexual Activity   Alcohol use: Yes    Comment: socially   Drug use: Never   Sexual activity: Not on file  Other Topics Concern   Not on file  Social History Narrative   Not on file    Social Drivers of Health   Financial Resource Strain: Low Risk  (03/24/2023)   Received from [facility]   Overall Financial Resource Strain (CARDIA)    Difficulty of Paying Living Expenses: Not hard at all  Food Insecurity: No Food Insecurity (03/24/2023)   Hunger Vital Sign    Worried About Running Out of Food in the Last Year: Never true    Ran Out of Food in the Last Year: Never true  Transportation Needs: No Transportation Needs (03/24/2023)   PRAPARE - Administrator, Civil Service (Medical): No    Lack of Transportation (Non-Medical): No  Physical Activity: Not on file  Stress: Not on file  Social Connections: Not on file  Intimate Partner Violence: Not At Risk (03/24/2023)   Humiliation, Afraid, Rape, and Kick questionnaire    Fear of Current or Ex-Partner: No    Emotionally Abused: No    Physically Abused: No    Sexually Abused: No    Physical Exam: Vital signs in last 24 hours: @BP  120/85   Pulse 78   Temp 98 F (36.7 C)   Ht 5' 10 (1.778 m)   Wt 215 lb (97.5 kg)   LMP 07/29/2022   SpO2 98%   BMI 30.85 kg/m  GEN: NAD EYE: Sclerae anicteric ENT: MMM CV: Non-tachycardic Pulm: CTA b/l GI: Soft, NT/ND NEURO:  Alert & Oriented x 3   Sandor Flatter, DO [facility]   07/20/2023 9:18 AM

## 2023-07-20 NOTE — Patient Instructions (Signed)
   Handouts on polyps &  hemorrhoids given to you today.   Await pathology results on polyps removed & biopsies done    Use fiber, such as FiberCon ,Citrucel,Metamucil, Or Konsyl     YOU HAD AN ENDOSCOPIC PROCEDURE TODAY AT THE [facility]:   Refer to the procedure report that was given to you for any specific questions about what was found during the examination.  If the procedure report does not answer your questions, please call your gastroenterologist to clarify.  If you requested that your care partner not be given the details of your procedure findings, then the procedure report has been included in a sealed envelope for you to review at your convenience later.  YOU SHOULD EXPECT: Some feelings of bloating in the abdomen. Passage of more gas than usual.  Walking can help get rid of the air that was put into your GI tract during the procedure and reduce the bloating. If you had a lower endoscopy (such as a colonoscopy or flexible sigmoidoscopy) you may notice spotting of blood in your stool or on the toilet paper. If you underwent a bowel prep for your procedure, you may not have a normal bowel movement for a few days.  Please Note:  You might notice some irritation and congestion in your nose or some drainage.  This is from the oxygen used during your procedure.  There is no need for concern and it should clear up in a day or so.  SYMPTOMS TO REPORT IMMEDIATELY:  Following lower endoscopy (colonoscopy or flexible sigmoidoscopy):  Excessive amounts of blood in the stool  Significant tenderness or worsening of abdominal pains  Swelling of the abdomen that is new, acute  Fever of 100F or higher   For urgent or emergent issues, a gastroenterologist can be reached at any hour by calling [phone]. Do not use MyChart messaging for urgent concerns.    DIET:  We do recommend a small meal at first, but then you may proceed to your regular diet.  Drink plenty of fluids but  you should avoid alcoholic beverages for 24 hours.  ACTIVITY:  You should plan to take it easy for the rest of today and you should NOT DRIVE or use heavy machinery until tomorrow (because of the sedation medicines used during the test).    FOLLOW UP: Our staff will call the number listed on your records the next business day following your procedure.  We will call around 7:15- 8:00 am to check on you and address any questions or concerns that you may have regarding the information given to you following your procedure. If we do not reach you, we will leave a message.     If any biopsies were taken you will be contacted by phone or by letter within the next 1-3 weeks.  Please call us  at [phone] if you have not heard about the biopsies in 3 weeks.    SIGNATURES/CONFIDENTIALITY: You and/or your care partner have signed paperwork which will be entered into your electronic medical record.  These signatures attest to the fact that that the information above on your After Visit Summary has been reviewed and is understood.  Full responsibility of the confidentiality of this discharge information lies with you and/or your care-partner.

## 2023-07-20 NOTE — Op Note (Signed)
 [facility] Patient Name: Heidi Bender Procedure Date: 07/20/2023 9:14 AM MRN: [id number] Endoscopist: Sandor Flatter , MD, [id number] Age: 43 Referring MD:  Date of Birth: Jun 30, 1981 Gender: Female Account #: [id number] Procedure:                Colonoscopy Indications:              Hematochezia, Change in bowel habits, Diarrhea Medicines:                Monitored Anesthesia Care Procedure:                Pre-Anesthesia Assessment:                           - Prior to the procedure, a History and Physical                            was performed, and patient medications and                            allergies were reviewed. The patient's tolerance of                            previous anesthesia was also reviewed. The risks                            and benefits of the procedure and the sedation                            options and risks were discussed with the patient.                            All questions were answered, and informed consent                            was obtained. Prior Anticoagulants: The patient has                            taken no anticoagulant or antiplatelet agents. ASA                            Grade Assessment: II - A patient with mild systemic                            disease. After reviewing the risks and benefits,                            the patient was deemed in satisfactory condition to                            undergo the procedure.                           After obtaining informed consent, the colonoscope  was passed under direct vision. Throughout the                            procedure, the patient's blood pressure, pulse, and                            oxygen saturations were monitored continuously. The                            Olympus CF-HQ190L ([id number]) Colonoscope was                            introduced through the anus and advanced to the the                             ileocolonic anastomosis. The colonoscopy was                            performed without difficulty. The patient tolerated                            the procedure well. The quality of the bowel                            preparation was good. Scope In: 9:29:29 AM Scope Out: 9:53:14 AM Scope Withdrawal Time: 0 hours 20 minutes 17 seconds  Total Procedure Duration: 0 hours 23 minutes 45 seconds  Findings:                 The perianal and digital rectal examinations were                            normal.                           Two sessile polyps were found in the sigmoid colon                            and transverse colon. The polyps were 3 to 4 mm in                            size. These polyps were removed with a cold snare.                            Resection and retrieval were complete. Estimated                            blood loss was minimal.                           There was evidence of a prior end-to-side                            ileo-colonic anastomosis in the ascending colon.  This was patent and was characterized by healthy                            appearing mucosa.                           Normal mucosa was found in the remainder of the                            colon. Biopsies for histology were taken with a                            cold forceps from the right colon and left colon                            for evaluation of microscopic colitis. Estimated                            blood loss was minimal.                           Non-bleeding internal hemorrhoids were found during                            retroflexion. The hemorrhoids were small. Complications:            No immediate complications. Estimated Blood Loss:     Estimated blood loss was minimal. Impression:               - Two 3 to 4 mm polyps in the sigmoid colon and in                            the transverse colon, removed with a cold snare.                             Resected and retrieved.                           - Patent end-to-side ileo-colonic anastomosis,                            characterized by healthy appearing mucosa.                           - Normal mucosa in the entire examined colon.                            Biopsied.                           - Non-bleeding internal hemorrhoids. Recommendation:           - Patient has a contact number available for                            emergencies. The signs and symptoms of potential  delayed complications were discussed with the                            patient. Return to normal activities tomorrow.                            Written discharge instructions were provided to the                            patient.                           - Resume previous diet.                           - Continue present medications.                           - Await pathology results.                           - Repeat colonoscopy for surveillance based on                            pathology results.                           - Return to GI clinic PRN.                           - Use fiber, for example Citrucel, Fibercon, Konsyl                            or Metamucil. Sandor Flatter, MD 07/20/2023 10:00:21 AM

## 2023-07-20 NOTE — Progress Notes (Signed)
 Sedate, gd SR, tolerated procedure well, VSS, report to RN

## 2023-07-20 NOTE — Progress Notes (Signed)
 Agree with the assessment and plan as outlined by Doug Sou, PA-C. ? ?Aubriegh Minch, DO, FACG ? ?

## 2023-07-20 NOTE — Progress Notes (Signed)
 Called to room to assist during endoscopic procedure.  Patient ID and intended procedure confirmed with present staff. Received instructions for my participation in the procedure from the performing physician.

## 2023-07-21 ENCOUNTER — Telehealth: Payer: Self-pay | Admitting: *Deleted

## 2023-07-21 NOTE — Telephone Encounter (Signed)
  Follow up Call-     07/20/2023    8:18 AM  Call back number  Post procedure Call Back phone  # [phone]  Permission to leave phone message Yes     Patient questions:  Do you have a fever, pain , or abdominal swelling? Yes.   Pain Score  0 *  Have you tolerated food without any problems? Yes.    Have you been able to return to your normal activities? Yes.    Do you have any questions about your discharge instructions: Diet   No. Medications  No. Follow up visit  No.  Do you have questions or concerns about your Care? No.  Actions: * If pain score is 4 or above: No action needed, pain <4.

## 2023-07-23 LAB — SURGICAL PATHOLOGY

## 2023-07-30 ENCOUNTER — Encounter: Payer: Self-pay | Admitting: Gastroenterology

## 2023-11-13 ENCOUNTER — Ambulatory Visit (HOSPITAL_BASED_OUTPATIENT_CLINIC_OR_DEPARTMENT_OTHER)
Admission: EM | Admit: 2023-11-13 | Discharge: 2023-11-13 | Disposition: A | Discharge status: Home / Self Care | Attending: Family Medicine | Admitting: Family Medicine

## 2023-11-13 ENCOUNTER — Other Ambulatory Visit: Payer: Self-pay

## 2023-11-13 ENCOUNTER — Encounter (HOSPITAL_BASED_OUTPATIENT_CLINIC_OR_DEPARTMENT_OTHER): Payer: Self-pay | Admitting: Emergency Medicine

## 2023-11-13 DIAGNOSIS — H66001 Acute suppurative otitis media without spontaneous rupture of ear drum, right ear: Secondary | ICD-10-CM

## 2023-11-13 DIAGNOSIS — J0140 Acute pansinusitis, unspecified: Secondary | ICD-10-CM | POA: Diagnosis not present

## 2023-11-13 DIAGNOSIS — R051 Acute cough: Secondary | ICD-10-CM | POA: Diagnosis not present

## 2023-11-13 DIAGNOSIS — R509 Fever, unspecified: Secondary | ICD-10-CM

## 2023-11-13 LAB — POC COVID19/FLU A&B COMBO
Covid Antigen, POC: NEGATIVE
Influenza A Antigen, POC: NEGATIVE
Influenza B Antigen, POC: NEGATIVE

## 2023-11-13 MED ORDER — FLUTICASONE PROPIONATE 50 MCG/ACT NA SUSP: 1.0000 | Freq: Two times a day (BID) | NASAL | 0 refills | Status: AC | PRN

## 2023-11-13 MED ORDER — CEFDINIR 300 MG PO CAPS: 300.0000 mg | ORAL_CAPSULE | Freq: Two times a day (BID) | ORAL | 0 refills | Status: AC

## 2023-11-13 NOTE — ED Provider Notes (Signed)
 [facility]    CSN: [id number] Arrival date & time: 11/13/23  0908      History   Chief Complaint Chief Complaint  Patient presents with   Nasal Congestion    HPI Heidi Bender is a 43 y.o. female.   Patient reports nasal congestion, sinus pressure and pain, sore throat, headache, fever since 11/11/2023.  She thinks this is possibly allergies but she did know she had a fever.  The history is provided by the patient. The history is limited by a language barrier (The patient speaks Spanish is her primary language but also speaks [place].  She feels that her English has improved and she declined an interpreter service).    Past Medical History:  Diagnosis Date   Anemia    Ovarian cyst    Vitamin D deficiency     Patient Active Problem List   Diagnosis Date Noted   Rectal bleeding 07/13/2023   Change in bowel habits 07/13/2023   Postoperative pain 03/24/2023   Acute perforated appendicitis 02/11/2023    Past Surgical History:  Procedure Laterality Date   CHOLECYSTECTOMY     DILATION AND CURETTAGE OF UTERUS     LAPAROSCOPIC APPENDECTOMY N/A 02/11/2023   Procedure: APPENDECTOMY LAPAROSCOPIC;  Surgeon: Dorena Gander, MD;  Location: [facility] OR;  Service: General;  Laterality: N/A;   LAPAROSCOPIC APPENDECTOMY N/A 03/26/2023   Procedure: APPENDECTOMY LAPAROSCOPIC ATTEMPTED,  OPEN ILEOCECECTOMY AND APPENDECTOMY;  Surgeon: Lujean Sake, MD;  Location: [facility] OR;  Service: General;  Laterality: N/A;    OB History   No obstetric history on file.      Home Medications    Prior to Admission medications   Medication Sig Start Date End Date Taking? Authorizing Provider  cefdinir (OMNICEF) 300 MG capsule Take 1 capsule (300 mg total) by mouth 2 (two) times daily for 10 days. 11/13/23 11/23/23 Yes Guss Legacy, FNP  fluticasone (FLONASE) 50 MCG/ACT nasal spray Place 1 spray into both nostrils 2 (two) times daily as needed for rhinitis. 11/13/23 12/13/23 Yes Guss Legacy, FNP   acetaminophen  (TYLENOL ) 500 MG tablet Take 2 tablets (1,000 mg total) by mouth every 8 (eight) hours as needed for mild pain. 03/30/23   Lujean Sake, MD  cetirizine (ZYRTEC) 10 MG tablet  07/16/23   [provider]  ibuprofen  (ADVIL ) 400 MG tablet Take 1 tablet (400 mg total) by mouth every 8 (eight) hours as needed for mild pain. 03/30/23   Lujean Sake, MD  Vitamin D, Ergocalciferol, (DRISDOL) 1.25 MG (50000 UNIT) CAPS capsule Take 50,000 Units by mouth every 7 (seven) days.    [provider]    Family History Family History  Problem Relation Age of Onset   Diabetes Mother    Diabetes Father    Diabetes Sister    Diabetes Maternal Grandmother    Diabetes Maternal Grandfather    Colon cancer Neg Hx    Esophageal cancer Neg Hx    Colon polyps Neg Hx    Rectal cancer Neg Hx    Stomach cancer Neg Hx     Social History Social History   Tobacco Use   Smoking status: Never   Smokeless tobacco: Never  Vaping Use   Vaping status: Never Used  Substance Use Topics   Alcohol use: Yes    Comment: socially   Drug use: Never     Allergies   Patient has no known allergies.   Review of Systems Review of Systems  Constitutional:  Positive  for fever. Negative for chills.  HENT:  Positive for congestion, postnasal drip, rhinorrhea, sinus pressure and sinus pain. Negative for ear pain and sore throat.   Eyes:  Negative for pain and visual disturbance.  Respiratory:  Positive for cough. Negative for shortness of breath.   Cardiovascular:  Negative for chest pain and palpitations.  Gastrointestinal:  Negative for abdominal pain, constipation, diarrhea, nausea and vomiting.  Genitourinary:  Negative for dysuria and hematuria.  Musculoskeletal:  Negative for arthralgias and back pain.  Skin:  Negative for color change and rash.  Neurological:  Positive for headaches. Negative for seizures and syncope.  All other systems reviewed and are negative.    Physical  Exam Triage Vital Signs ED Triage Vitals [11/13/23 0918]  Encounter Vitals Group     BP 124/82     Systolic BP Percentile      Diastolic BP Percentile      Pulse Rate 83     Resp 16     Temp 98.2 F (36.8 C)     Temp Source Oral     SpO2 98 %     Weight      Height      Head Circumference      Peak Flow      Pain Score 8     Pain Loc      Pain Education      Exclude from Growth Chart    No data found.  Updated Vital Signs BP 124/82 (BP Location: Right Arm)   Pulse 83   Temp 98.2 F (36.8 C) (Oral)   Resp 16   LMP 07/29/2022   SpO2 98%   Visual Acuity Right Eye Distance:   Left Eye Distance:   Bilateral Distance:    Right Eye Near:   Left Eye Near:    Bilateral Near:     Physical Exam Vitals and nursing note reviewed.  Constitutional:      General: She is not in acute distress.    Appearance: She is well-developed. She is ill-appearing. She is not toxic-appearing.  HENT:     Head: Normocephalic and atraumatic.     Right Ear: Hearing, ear canal and external ear normal. A middle ear effusion (Mild and serous fluid) is present. Tympanic membrane is erythematous. Tympanic membrane is not bulging.     Left Ear: Hearing, tympanic membrane, ear canal and external ear normal.     Nose: Mucosal edema, congestion and rhinorrhea present. Rhinorrhea is clear.     Right Sinus: Maxillary sinus tenderness and frontal sinus tenderness present.     Left Sinus: Maxillary sinus tenderness and frontal sinus tenderness present.     Comments: Sinus tenderness is mild to moderate    Mouth/Throat:     Lips: Pink.     Mouth: Mucous membranes are moist.     Pharynx: Uvula midline. No oropharyngeal exudate or posterior oropharyngeal erythema.     Tonsils: No tonsillar exudate.  Eyes:     Conjunctiva/sclera: Conjunctivae normal.     Pupils: Pupils are equal, round, and reactive to light.  Cardiovascular:     Rate and Rhythm: Normal rate and regular rhythm.     Heart sounds: S1  normal and S2 normal. No murmur heard. Pulmonary:     Effort: Pulmonary effort is normal. No respiratory distress.     Breath sounds: Normal breath sounds. No decreased breath sounds, wheezing, rhonchi or rales.  Abdominal:     General: Bowel sounds are normal.  Palpations: Abdomen is soft.     Tenderness: There is no abdominal tenderness.  Musculoskeletal:        General: No swelling.     Cervical back: Neck supple.  Lymphadenopathy:     Head:     Right side of head: No submental, submandibular, tonsillar, preauricular or posterior auricular adenopathy.     Left side of head: No submental, submandibular, tonsillar, preauricular or posterior auricular adenopathy.     Cervical: Cervical adenopathy present.     Right cervical: Superficial cervical adenopathy present.     Left cervical: Superficial cervical adenopathy present.  Skin:    General: Skin is warm and dry.     Capillary Refill: Capillary refill takes less than 2 seconds.     Findings: No rash.  Neurological:     Mental Status: She is alert and oriented to person, place, and time.  Psychiatric:        Mood and Affect: Mood normal.      UC Treatments / Results  Labs (all labs ordered are listed, but only abnormal results are displayed) Labs Reviewed  POC COVID19/FLU A&B COMBO - Normal    EKG   Radiology No results found.  Procedures Procedures (including critical care time)  Medications Ordered in UC Medications - No data to display  Initial Impression / Assessment and Plan / UC Course  I have reviewed the triage vital signs and the nursing notes.  Pertinent labs & imaging results that were available during my care of the patient were reviewed by me and considered in my medical decision making (see chart for details).     Plan of Care: Otitis media, fever, cough: Flu and COVID are negative.  She has a right ear infection.  Will treat with fluticasone nasal spray, 1 spray into each nostril once daily  and may do twice daily if needed.  Cefdinir 300 mg twice daily for 10 days.  Get plenty of fluids and rest. Sinusitis: I think she has early sinusitis.  Encouraged sinus rinses.  She will also have both cefdinir and fluticasone for the ear infection and they should help the sinuses to.  Follow-up if symptoms do not improve, worsen or new symptoms occur. Final Clinical Impressions(s) / UC Diagnoses   Final diagnoses:  Acute cough  Fever, unspecified  Non-recurrent acute suppurative otitis media of right ear without spontaneous rupture of tympanic membrane  Acute non-recurrent pansinusitis     Discharge Instructions      Otitis media, fever, cough: Rapid strep and rapid flu were negative.  Will treat with fluticasone nasal spray, 1 spray into each nostril once daily and if she is really congested she can do it twice daily.  Cefdinir 300 mg twice daily for 10 days for the ear infection.  Get plenty of fluids and rest.  Sinusitis: I believe she has sinusitis but it is early.  Encouraged sinus rinses.  She will have fluticasone for the ear infection and for the sinuses.  And she will have cefdinir for the ear infection but it would also help with sinuses.  Follow-up if symptoms do not improve, worsen or new symptoms occur.  Google Translation - Traduccin de Google: Otitis media, fiebre, tos: Las pruebas rpidas de estreptococos y gripe dieron negativo. Se tratar con aerosol nasal de fluticasona: una aplicacin en cada fosa nasal una vez al da y, si est muy congestionada, puede [employer] veces al da. Cefdinir 300 mg dos veces al da durante [address] para  la infeccin de odo. Beba abundante lquido y descanse.  Sinusitis: Creo que tiene sinusitis, pero es pronto. Se recomiendan lavados nasales. Se le administrar fluticasona para la infeccin de odo y para los senos paranasales. Tambin se [employer] cefdinir para la infeccin de odo, pero tambin le ayudar con los senos  paranasales.  Se realizar un seguimiento si los sntomas no mejoran, empeoran o aparecen nuevos.     ED Prescriptions     Medication Sig Dispense Auth. Provider   cefdinir (OMNICEF) 300 MG capsule Take 1 capsule (300 mg total) by mouth 2 (two) times daily for 10 days. 20 capsule Guss Legacy, FNP   fluticasone (FLONASE) 50 MCG/ACT nasal spray Place 1 spray into both nostrils 2 (two) times daily as needed for rhinitis. 17 mL Guss Legacy, FNP      PDMP not reviewed this encounter.   Guss Legacy, FNP 11/13/23 [phone]

## 2023-11-13 NOTE — ED Triage Notes (Signed)
 Pt reports a lot of nasal congestion, sinus pressure, sore throat and HA for the past 3 days.

## 2023-11-13 NOTE — Discharge Instructions (Addendum)
 Otitis media, fever, cough: Rapid strep and rapid flu were negative.  Will treat with fluticasone nasal spray, 1 spray into each nostril once daily and if she is really congested she can do it twice daily.  Cefdinir 300 mg twice daily for 10 days for the ear infection.  Get plenty of fluids and rest.  Sinusitis: I believe she has sinusitis but it is early.  Encouraged sinus rinses.  She will have fluticasone for the ear infection and for the sinuses.  And she will have cefdinir for the ear infection but it would also help with sinuses.  Follow-up if symptoms do not improve, worsen or new symptoms occur.  Google Translation - Traduccin de Google: Otitis media, fiebre, tos: Las pruebas rpidas de estreptococos y gripe dieron negativo. Se tratar con aerosol nasal de fluticasona: una aplicacin en cada fosa nasal una vez al da y, si est muy congestionada, puede [employer] veces al da. Cefdinir 300 mg dos veces al da durante 10 das para la infeccin de odo. Beba abundante lquido y descanse.  Sinusitis: Creo que tiene sinusitis, pero es pronto. Se recomiendan lavados nasales. Se le administrar fluticasona para la infeccin de odo y para los senos paranasales. Tambin se [employer] cefdinir para la infeccin de odo, pero tambin le ayudar con los senos paranasales.  Se realizar un seguimiento si los sntomas no mejoran, empeoran o aparecen nuevos.
